# Patient Record
Sex: Female | Born: 1955 | Race: Black or African American | Hispanic: No | Marital: Married | Smoking: Never smoker
Health system: Southern US, Community
[De-identification: ages and names within clinical notes are randomized; demographics above are authoritative.]

## PROBLEM LIST (undated history)

## (undated) DIAGNOSIS — E785 Hyperlipidemia, unspecified: Secondary | ICD-10-CM

## (undated) DIAGNOSIS — D259 Leiomyoma of uterus, unspecified: Secondary | ICD-10-CM

## (undated) DIAGNOSIS — E119 Type 2 diabetes mellitus without complications: Secondary | ICD-10-CM

## (undated) DIAGNOSIS — R002 Palpitations: Secondary | ICD-10-CM

## (undated) DIAGNOSIS — K219 Gastro-esophageal reflux disease without esophagitis: Secondary | ICD-10-CM

## (undated) DIAGNOSIS — M1990 Unspecified osteoarthritis, unspecified site: Secondary | ICD-10-CM

## (undated) DIAGNOSIS — I4891 Unspecified atrial fibrillation: Secondary | ICD-10-CM

## (undated) DIAGNOSIS — I10 Essential (primary) hypertension: Secondary | ICD-10-CM

## (undated) DIAGNOSIS — E669 Obesity, unspecified: Secondary | ICD-10-CM

## (undated) DIAGNOSIS — F419 Anxiety disorder, unspecified: Secondary | ICD-10-CM

## (undated) DIAGNOSIS — H269 Unspecified cataract: Secondary | ICD-10-CM

## (undated) DIAGNOSIS — D649 Anemia, unspecified: Secondary | ICD-10-CM

## (undated) HISTORY — DX: Unspecified atrial fibrillation: I48.91

## (undated) HISTORY — DX: Essential (primary) hypertension: I10

## (undated) HISTORY — DX: Obesity, unspecified: E66.9

## (undated) HISTORY — PX: POLYPECTOMY: SHX149

## (undated) HISTORY — DX: Leiomyoma of uterus, unspecified: D25.9

## (undated) HISTORY — DX: Type 2 diabetes mellitus without complications: E11.9

## (undated) HISTORY — DX: Gastro-esophageal reflux disease without esophagitis: K21.9

## (undated) HISTORY — DX: Anxiety disorder, unspecified: F41.9

## (undated) HISTORY — DX: Palpitations: R00.2

## (undated) HISTORY — DX: Unspecified osteoarthritis, unspecified site: M19.90

## (undated) HISTORY — DX: Unspecified cataract: H26.9

## (undated) HISTORY — PX: ABDOMINAL HYSTERECTOMY: SHX81

## (undated) HISTORY — DX: Anemia, unspecified: D64.9

## (undated) HISTORY — PX: COLONOSCOPY: SHX174

## (undated) HISTORY — DX: Hyperlipidemia, unspecified: E78.5

---

## 1998-02-19 HISTORY — PX: HERNIA REPAIR: SHX51

## 1998-04-16 ENCOUNTER — Encounter: Payer: Self-pay | Admitting: Emergency Medicine

## 1998-04-16 ENCOUNTER — Emergency Department (HOSPITAL_COMMUNITY): Admission: EM | Admit: 1998-04-16 | Discharge: 1998-04-16 | Payer: Self-pay | Admitting: Emergency Medicine

## 1999-03-20 ENCOUNTER — Other Ambulatory Visit: Admission: RE | Admit: 1999-03-20 | Discharge: 1999-03-20 | Payer: Self-pay | Admitting: Internal Medicine

## 1999-03-20 ENCOUNTER — Encounter (INDEPENDENT_AMBULATORY_CARE_PROVIDER_SITE_OTHER): Payer: Self-pay | Admitting: Specialist

## 2000-07-03 ENCOUNTER — Emergency Department (HOSPITAL_COMMUNITY): Admission: EM | Admit: 2000-07-03 | Discharge: 2000-07-03 | Payer: Self-pay | Admitting: Emergency Medicine

## 2000-07-03 ENCOUNTER — Encounter: Payer: Self-pay | Admitting: *Deleted

## 2000-07-29 ENCOUNTER — Encounter: Payer: Self-pay | Admitting: *Deleted

## 2000-07-29 ENCOUNTER — Ambulatory Visit (HOSPITAL_COMMUNITY): Admission: RE | Admit: 2000-07-29 | Discharge: 2000-07-29 | Payer: Self-pay | Admitting: *Deleted

## 2001-08-04 ENCOUNTER — Ambulatory Visit (HOSPITAL_COMMUNITY): Admission: RE | Admit: 2001-08-04 | Discharge: 2001-08-04 | Payer: Self-pay | Admitting: Family Medicine

## 2001-08-04 ENCOUNTER — Encounter: Payer: Self-pay | Admitting: Family Medicine

## 2002-09-21 ENCOUNTER — Ambulatory Visit (HOSPITAL_COMMUNITY): Admission: RE | Admit: 2002-09-21 | Discharge: 2002-09-21 | Payer: Self-pay | Admitting: Internal Medicine

## 2002-09-21 ENCOUNTER — Encounter: Payer: Self-pay | Admitting: Internal Medicine

## 2002-09-25 ENCOUNTER — Ambulatory Visit (HOSPITAL_COMMUNITY): Admission: RE | Admit: 2002-09-25 | Discharge: 2002-09-25 | Payer: Self-pay | Admitting: Internal Medicine

## 2002-09-25 ENCOUNTER — Encounter: Payer: Self-pay | Admitting: Internal Medicine

## 2003-09-28 ENCOUNTER — Ambulatory Visit (HOSPITAL_COMMUNITY): Admission: RE | Admit: 2003-09-28 | Discharge: 2003-09-28 | Payer: Self-pay | Admitting: Internal Medicine

## 2004-02-20 HISTORY — PX: JOINT REPLACEMENT: SHX530

## 2004-09-29 ENCOUNTER — Ambulatory Visit (HOSPITAL_COMMUNITY): Admission: RE | Admit: 2004-09-29 | Discharge: 2004-09-29 | Payer: Self-pay | Admitting: Internal Medicine

## 2004-12-17 ENCOUNTER — Emergency Department (HOSPITAL_COMMUNITY): Admission: EM | Admit: 2004-12-17 | Discharge: 2004-12-17 | Payer: Self-pay | Admitting: Emergency Medicine

## 2005-01-05 ENCOUNTER — Inpatient Hospital Stay (HOSPITAL_COMMUNITY): Admission: RE | Admit: 2005-01-05 | Discharge: 2005-01-08 | Payer: Self-pay | Admitting: Orthopedic Surgery

## 2005-02-26 ENCOUNTER — Encounter (HOSPITAL_COMMUNITY): Admission: RE | Admit: 2005-02-26 | Discharge: 2005-03-28 | Payer: Self-pay | Admitting: Orthopedic Surgery

## 2005-10-01 ENCOUNTER — Ambulatory Visit (HOSPITAL_COMMUNITY): Admission: RE | Admit: 2005-10-01 | Discharge: 2005-10-01 | Payer: Self-pay | Admitting: Family Medicine

## 2006-10-07 ENCOUNTER — Ambulatory Visit (HOSPITAL_COMMUNITY): Admission: RE | Admit: 2006-10-07 | Discharge: 2006-10-07 | Payer: Self-pay | Admitting: Family Medicine

## 2007-10-09 ENCOUNTER — Ambulatory Visit (HOSPITAL_COMMUNITY): Admission: RE | Admit: 2007-10-09 | Discharge: 2007-10-09 | Payer: Self-pay | Admitting: Internal Medicine

## 2008-10-11 ENCOUNTER — Ambulatory Visit (HOSPITAL_COMMUNITY): Admission: RE | Admit: 2008-10-11 | Discharge: 2008-10-11 | Payer: Self-pay | Admitting: Internal Medicine

## 2008-12-01 ENCOUNTER — Ambulatory Visit (HOSPITAL_COMMUNITY): Admission: RE | Admit: 2008-12-01 | Discharge: 2008-12-01 | Payer: Self-pay | Admitting: Family Medicine

## 2009-04-08 ENCOUNTER — Encounter (INDEPENDENT_AMBULATORY_CARE_PROVIDER_SITE_OTHER): Payer: Self-pay | Admitting: General Surgery

## 2009-04-08 ENCOUNTER — Inpatient Hospital Stay (HOSPITAL_COMMUNITY): Admission: RE | Admit: 2009-04-08 | Discharge: 2009-04-10 | Payer: Self-pay | Admitting: General Surgery

## 2009-10-13 ENCOUNTER — Ambulatory Visit (HOSPITAL_COMMUNITY): Admission: RE | Admit: 2009-10-13 | Discharge: 2009-10-13 | Payer: Self-pay | Admitting: Internal Medicine

## 2009-12-15 ENCOUNTER — Encounter (HOSPITAL_COMMUNITY)
Admission: RE | Admit: 2009-12-15 | Discharge: 2010-01-14 | Payer: Self-pay | Source: Home / Self Care | Admitting: Internal Medicine

## 2010-03-12 ENCOUNTER — Encounter: Payer: Self-pay | Admitting: Family Medicine

## 2010-05-11 LAB — COMPREHENSIVE METABOLIC PANEL
ALT: 20 U/L (ref 0–35)
AST: 23 U/L (ref 0–37)
Albumin: 3.8 g/dL (ref 3.5–5.2)
Alkaline Phosphatase: 59 U/L (ref 39–117)
BUN: 13 mg/dL (ref 6–23)
CO2: 28 mEq/L (ref 19–32)
Calcium: 9.5 mg/dL (ref 8.4–10.5)
Chloride: 105 mEq/L (ref 96–112)
Creatinine, Ser: 0.73 mg/dL (ref 0.4–1.2)
GFR calc Af Amer: >60 mL/min (ref >60)
GFR calc non Af Amer: >60 mL/min (ref >60)
Glucose, Bld: 95 mg/dL (ref 70–99)
Potassium: 4.1 mEq/L (ref 3.5–5.1)
Sodium: 141 mEq/L (ref 135–145)
Total Bilirubin: 0.4 mg/dL (ref 0.3–1.2)
Total Protein: 6.8 g/dL (ref 6.0–8.3)

## 2010-05-11 LAB — DIFFERENTIAL
Basophils Absolute: 0.1 10*3/uL (ref 0.0–0.1)
Basophils Relative: 1 % (ref 0–1)
Eosinophils Absolute: 0.1 10*3/uL (ref 0.0–0.7)
Eosinophils Relative: 1 % (ref 0–5)
Lymphocytes Relative: 29 % (ref 12–46)
Lymphs Abs: 1.8 10*3/uL (ref 0.7–4.0)
Monocytes Absolute: 0.6 10*3/uL (ref 0.1–1.0)
Monocytes Relative: 10 % (ref 3–12)
Neutro Abs: 3.7 10*3/uL (ref 1.7–7.7)
Neutrophils Relative %: 59 % (ref 43–77)

## 2010-05-11 LAB — CBC
HCT: 39.7 % (ref 36.0–46.0)
Hemoglobin: 13 g/dL (ref 12.0–15.0)
MCHC: 32.7 g/dL (ref 30.0–36.0)
MCV: 82.9 fL (ref 78.0–100.0)
Platelets: 233 10*3/uL (ref 150–400)
RBC: 4.79 MIL/uL (ref 3.87–5.11)
RDW: 16.1 % — ABNORMAL HIGH (ref 11.5–15.5)
WBC: 6.3 10*3/uL (ref 4.0–10.5)

## 2010-05-25 LAB — CREATININE, SERUM
Creatinine, Ser: 0.8 mg/dL (ref 0.4–1.2)
GFR calc Af Amer: >60 mL/min (ref >60)
GFR calc non Af Amer: >60 mL/min (ref >60)

## 2010-07-07 NOTE — Discharge Summary (Signed)
NAMEGERLINE, RATTO        ACCOUNT NO.:  [id number]   MEDICAL RECORD NO.:  [id number]          PATIENT TYPE:  INP   LOCATION:  5009                         FACILITY:  [facility]   PHYSICIAN:  Dyke Brackett, M.D.    DATE OF BIRTH:  15-Apr-1955   DATE OF ADMISSION:  01/05/2005  DATE OF DISCHARGE:  01/08/2005                                 DISCHARGE SUMMARY   ADMISSION DIAGNOSES:  1.  End-stage osteoarthritis -- bilateral knees, left worse than right.  2.  Gastroesophageal reflux disease.  3.  Hypertension.  4.  History of recent healed peptic ulcer disease.   DISCHARGE DIAGNOSES:  1.  End-stage osteoarthritis -- bilateral knees, status post left total knee      arthroplasty.  2.  Acute blood loss anemia secondary to surgery.  3.  Hypokalemia.  4.  Mild cellulitis at site of incision.  5.  Gastroesophageal reflux disease.  6.  Hypertension.  7.  History of recent healed peptic ulcer.   SURGICAL PROCEDURES:  On January 05, 2005 Ms. Lackman underwent a left  total knee arthroplasty by Dr. Marcie Mowers, assisted by Richardean Canal  PA-C. She had a DePuy NBT keel tibial tray cemented size 3 with an LCS  complete primary femoral component cemented size standard left. A LCS  complete metal back patella cemented size standard and LCS complete RP  insert size standard 15 mm thickness.   COMPLICATIONS:  None.   CONSULTS:  1.  Physical therapy case management consult, January 06, 2005.  2.  Occupational therapy consult, January 08, 2005.   HISTORY OF PRESENT ILLNESS:  This 55 year old year old female presented to  Dr. Madelon Lips with an 8-year history of gradual onset of progressive left knee  pain. Pain is a constant, sharp sensation over the medial joint line without  radiation. It increases with prolonged sitting, and decreases with  extension, rest and ice. The knee pops, catches, and gives way and swells.  It keeps her up at night. She has failed conservative treatment  and x-rays  show end-stage arthritic changes. Because of that, she is presenting for a  left knee replacement.   HOSPITAL COURSE:  Ms. Speece tolerated her surgical procedure well and  without immediate postoperative complications. She was transferred to 5000.  On Postop day #1 she was afebrile, and vitals stable. Her leg was  neurovascularly intact. She was started on therapeutic protocol and switched  to p.o. pain medications.   Postop day #2 she continued to do well. The left knee incision was benign.  Hemoglobin 9.9, hematocrit 29.4. She was continued on therapy.   On postop day #3 she was noted to have a low potassium the day before, and  that was supplemented. She was having some redness about the knee incision,  so she was started on Keflex 500 mg p.o. q.i.d. for 7 days. She was  otherwise doing well, and it was felt that she was stable for discharge  home; was discharged home later that day.   DISCHARGE INSTRUCTIONS:   DIET:  She can resume her regular prehospitalization diet.   MEDICATIONS:  She may resume  her prehospitalization medications, except no  Celebrex, diclofenac or hydrocodone while on her other medications. Home  medications  otherwise will include:  1.  Enalapril 10 mg p.o. q.a.m..  2.  Hydrochlorothiazide 25 mg p.o. q.a.m.Marland Kitchen  3.  Prevacid 30 mg p.o. q.a.m.Marland Kitchen  4.  Multivitamin 1 tablet p.o. q.a.m..  5.  Chondroitin and glucosamine p.r.n..   Additional medications at this time include:  1.  Lovenox 40 mg subcutaneously q. 6 a.m. (last dose January 20, 2005).  2.  Percocet 5/325 mg 1-2 tablets p.o. q.4-6 h p.r.n. for pain.  3.  Keflex 500 mg 1 tablet p.o. q.i.d. for 7 days.  4.  K-Dur 20 mEq 1 tablet p.o. q.a.m. x3 days.  5.  OxyContin 10 mg 1 tablet p.o. q.12 h for pain.   ACTIVITY:  She can be out of bed; 50% weightbearing on the left leg with the  use of the walker.  She is to have PT per [facility], and home  CPM 0-90 degrees 6-8 hours  a day; and it is to increase to that limit.  Please see the blue total knee discharge sheet for further activity  instructions.   WOUND CARE:  She is to keep the knee incision clean and dry, and to change  the dressing daily. She is to notify Dr. Madelon Lips of a temperature greater  than 101.5 degrees Fahrenheit, foul-smelling drainage from the wound, or  pain not under control. She is to wear the TED stockings during the day.   FOLLOW-UP:  She is to follow up with Dr. Madelon Lips in our office in 11 days,  and is to call [phone] for that appointment.   LABORATORY DATA:  X-ray taken of her left knee on November 17 showed the  knee replacement without evidence for immediate hardware complication.   Hemoglobin/hematocrit ranged from 12.6 and 38.3 on November 13, to a low of  9.7 and 28.6 on November 20. White count ranged from 6.6 on November 13 to  12.3 on November 20; and platelets remained within normal limits.   Sodium dropped to low of 134 on November 18, otherwise it was within normal  limits. Potassium went from 3.8 on November 13 to 2.6 on November 20.  Glucose ranged from 106 on November 13 to 141 on November 18. BUN and  creatinine were 8.0 and 0.9 on the 13th, and dropped to a low of 1.0 and 0.7  on the 19th. Calcium ranged from 9.9 on the 13th to 8.3 on the 19th. All  other laboratory studies were within normal limits.      Legrand Pitts Duffy, Arnetha Courser, M.D.  Electronically Signed    KED/MEDQ  D:  03/02/2005  T:  03/02/2005  Job:  [id number]   cc:   Jeoffrey Massed, MD  Fax: [phone]   Madelin Rear. Sherwood Gambler, MD  Fax: [phone]

## 2010-07-07 NOTE — H&P (Signed)
NAME:  Emily Andersen, Emily Andersen        ACCOUNT NO.:  [id number]   MEDICAL RECORD NO.:  [id number]          PATIENT TYPE:  INP   LOCATION:  NA                           FACILITY:  [facility]   PHYSICIAN:  Dyke Brackett, M.D.    DATE OF BIRTH:  01-25-1956   DATE OF ADMISSION:  01/05/2005  DATE OF DISCHARGE:                                HISTORY & PHYSICAL   CHIEF COMPLAINT:  Left knee pain for the last 8 years.   HISTORY OF PRESENT ILLNESS:  This 55 year old female patient presents to Dr.  Madelon Lips with an 8-year history of gradual onset but progressively worsening  left knee pain. She has had no known injury or prior surgery to her left  knee. She has had similar problems and finding of early arthritis in the  right knee in the past.   At this point, the left knee pain is constant, sharp sensation over the  medial joint line without radiation. Pain increases with any prolonged  sitting at night or if she gets up from sitting down. Pain decreases with  extending the knee, rest, and ice. She does complain of the knee popping,  catching, giving way at times, and swelling. It does keep her up at night.  There is no grinding or locking. She has received Synvisc injections in the  right knee in the past and cortisone, and that has provided some relief. She  is using a pull-over knee sleeve on the left knee now, and that does help,  and she uses crutches as needed.   No known drug allergies.   CURRENT MEDICATIONS:  1.  Percocet 5/325 mg 1 tablet p.o. twice daily.  2.  Enalapril 10 mg 1 tablet p.o. q.a.m.  3.  Hydrochlorothiazide 25 mg 1 tablet p.o. q.a.m.  4.  Diclofenac 75 mg 1 tablet p.o. twice daily. She is to stop taking these      on November 12.  5.  Prevacid 30 mg 1 tablet p.o. q.a.m..  6.  Multivitamin 1 tablet p.o. q.a.m.Marland Kitchen  7.  Chondroitin and glucosamine.   PAST MEDICAL HISTORY:  1.  Hypertension diagnosed 2 years ago.  2.  Peptic ulcer, now healed, diagnosed in April 2006.  3.   Gastroesophageal reflux disease x14 years.   She denies any history of diabetes mellitus, thyroid disease, hiatal hernia,  heart disease, asthma, or any other chronic medical condition other than  noted previously.   PAST SURGICAL HISTORY:  1.  Colonoscopy with removal of polyp 2006 by Dr. Noe Gens.  2.  Upper GI, found to have one ulcer 2006 by Dr. Noe Gens.  3.  Partial hysterectomy in 1994 due to fibroids by Dr. Katrinka Blazing.  4.  Removal of left index finger from a lawnmower accident in 1999 by Dr.      Katrinka Blazing.   She denies any complications from the above-mentioned procedures.   SOCIAL HISTORY:  She denies any history of cigarette smoking, alcohol use or  drug use. She is married and has one son. She lives with her husband in a  one-story house, one step into the main entrance. 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 Her medical doctor is Dr. Regino Schultze and Dr.  Sherwood Gambler in [place] at [phone].   FAMILY HISTORY:  Mother is alive at age 8 with Alzheimer's. Father died at  age 59 with COPD and a heart attack. She has five brothers who are alive at  ages 44, 44, 5, 73, 39, and one sister alive at age 10. The sister does  have asthma. She had one brother who died of a gunshot wound and one sister  who died at age 51 with COPD, heart problems, and aspiration. Her son is 105.  He is alive and well.   REVIEW OF SYSTEMS:  She does have dentures on both upper and lower jaw line.  She wears glasses. She has some arthritic complaints of her right knee. She  has a history of a healed ulcer this year. She does not have a living will  nor power of attorney. All other systems are negative and noncontributory.   PHYSICAL EXAMINATION:  GENERAL:  Well-developed, well-nourished overweight  female in no acute distress. Walks with a significant right-sided limp and  antalgic gait. Mood and affect are appropriate. Talks easily with examiner.  Height 5 feet 1 inch, weight 237 pounds, BMI is 44.   VITAL SIGNS:  Temperature 98.2 degrees F, pulse 76, respirations 22, and  blood pressure 114/82.  HEENT: Normocephalic, atraumatic without frontal or maxillary sinus  tenderness to palpation. Conjunctivae pink. Sclerae anicteric. PERRLA. EOMs  intact. No visible external ear deformities. Hearing grossly intact.  Tympanic membranes pearly gray bilaterally with good light reflex. Nose and  nasal septum midline. Nasal mucosa pink and moist without exudates or polyps  noted. Buccal mucosa pink and moist. Dentures in place. Pharynx without  erythema or exudates. Tongue and uvula midline. Tongue without  fasciculations, and uvula rises equally with phonation.  NECK: No visible masses or lesions noted. Trachea midline. No palpable  lymphadenopathy or thyromegaly. Carotids +2 bilaterally without bruits. Full  range of motion, nontender to palpation along the cervical spine.  CARDIOVASCULAR:  Heart rate and rhythm regular. S1-S2 present without rubs,  clicks or murmurs noted.  RESPIRATORY: Respirations even and unlabored. Breath sounds clear to  auscultation bilaterally without rales or wheezes noted.  ABDOMEN: Rounded abdominal contour. Bowel sounds present x4 quadrants. Soft,  nontender to palpation without hepatosplenomegaly or CVA tenderness. Femoral  pulses +1 bilaterally. Nontender to palpation along the vertebral column.  BREASTS/GU/RECTAL/PELVIC: These exams deferred at this time.  MUSCULOSKELETAL: No obvious deformities bilateral upper extremities with the  exception of the missing portion of the and left index finger which is  amputated cleanly at the DIP joint. She has full range of motion of her  upper extremities without pain. Radial pulses +2 bilaterally. She has full  range of motion of her hips, ankles and toes bilaterally. DP and PT pulses  are +2. Mild +1 to 2 mildly pitting edema both lower extremities.  Right knee has full extension and flexion to 125 degrees with a lot of  gross crepitus with range of motion. No pain with palpation along the joint line.  Small +1 effusion. Stable varus and valgus stress. Negative anterior drawer.  Left knee skin intact without erythema or ecchymosis. She is lacking about 5  degrees to full extension and can flex the knee 110 degrees with some fine  crepitus. She is acutely tender to palpation over the medial joint line.  Plus one to two effusion. Stable to varus and valgus stress. Negative  anterior drawer.  NEUROLOGIC: Alert and oriented x3. Cranial nerves II-XII are grossly intact.  Strength 5/5 bilateral upper and lower extremities. Rapid alternating  movements intact. Deep tendon reflexes 2+ bilateral upper and lower  extremities. Sensation intact to light touch.   NEUROLOGIC FINDINGS:  Knee x-rays taken of her right knee in 2004 show  medial joint osteoarthritis. X-rays were taken of the left knee recently,  but we do not have the x-ray report of this time.   IMPRESSION:  1.  Osteoarthritis bilateral knees, left worse than right.  2.  Gastroesophageal reflux disease.  3.  Hypertension.  4.  History of recent healed peptic ulcer.   PLAN:  Ms. Deshmukh will be admitted to [facility] on January 05, 2005, where she will undergo a left total knee replacement by Dr. Lacretia Nicks.  Frederico Hamman. She will undergo all the routine preoperative laboratory  tests and studies prior to this procedure. She has been cleared for surgery  by Dr. Regino Schultze, her primary care physician. If we have any medical issues  while she is hospitalized, we will consult one of the hospitalists.      Legrand Pitts Duffy, Arnetha Courser, M.D.  Electronically Signed    KED/MEDQ  D:  12/26/2004  T:  12/26/2004  Job:  [id number]

## 2010-07-07 NOTE — Op Note (Signed)
NAME:  Emily Andersen, Emily Andersen        ACCOUNT NO.:  [id number]   MEDICAL RECORD NO.:  [id number]          PATIENT TYPE:  INP   LOCATION:  5009                         FACILITY:  [facility]   PHYSICIAN:  Dyke Brackett, M.D.    DATE OF BIRTH:  [birth year]   DATE OF PROCEDURE:  01/05/2005  DATE OF DISCHARGE:                                 OPERATIVE REPORT   PREOPERATIVE DIAGNOSIS:  Severe osteoarthritis, left knee, with varus  deformity.   POSTOPERATIVE DIAGNOSIS:  Severe osteoarthritis, left knee, with varus  deformity.   OPERATION:  Left total knee replacement (LCS cemented DePuy standard femur,  patella, size 3 tibia, with 15-mm bearing).   SURGEON:  Dyke Brackett, M.D.   [facility]SISTANTChestine Spore, P.A.   TOURNIQUET TIME:  1 hour, 45 minutes.   DESCRIPTION OF PROCEDURE:  Sterile prep and drape, examination of the leg,  inflation to 375 mm, straight skin incision, median parapatellar approach to  the knee made  Medial side of the knee was stripped due to the varus  deformity to release contracture of the medial collateral ligament.  Tibia  was cut about 2-3 mm below the most diseased medial compartment.  Due to the  severe deformity, a moderately aggressive cut was made relative to  correcting the varus using the external guide, followed by an anterior-  posterior cut with the flexion gap measured at 15 mm, later equalized to the  extension gap of 15.  Distal femoral cut with 40-degree valgus cut was made  followed by finishing a cut with chamfers and keel holes for the prosthesis.  Femoral trial fit anatomically.  The tibia had complete release of the PCL  off the tibial attachment, excision of remnants of the menisci.  The size 3  tibia was deemed to be appropriate, Keel hole was cut for the prosthesis  trial tibia, placed with a 15 mm bearing and the trial femur.  The patella  was cut leaving about 13 mm of native patella.  Three-peg patella trial.  The patient had full extension to slight  recurvatum noted.  Excellent  stability and extension.  Excellent balance medially and laterally.  No  flexion instability was noted.  Good symmetry was noted between flexion and  extension balance relative to the bearing.  Patella tracked normally.  After  the trials were removed, the bony surfaces were irrigated with pulsatile  lavage followed by insertion of the cement in the doughy state.  Tibia  followed by femur and patella, and after the cement was allowed to harden,  excess cement was removed.  The tourniquet was released.  Small bleeders  were coagulated.  Hemovac drain was placed exiting superolaterally.  The  closure was effected of the capsule with interrupted Ethibond, 2-0 Vicryl on  the subcutaneous tissues, and skin with skin clips.  Marcaine with  epinephrine infiltrated into the wound.  Lightly compressive sterile  dressing applied.  The patient taken to the recovery room in stable  condition.     Dyke Brackett, M.D.  Electronically Signed    WDC/MEDQ  D:  01/05/2005  T:  01/07/2005  Job:  [id number] 

## 2010-09-19 ENCOUNTER — Other Ambulatory Visit (HOSPITAL_COMMUNITY): Payer: Self-pay | Admitting: Family Medicine

## 2010-09-19 DIAGNOSIS — Z139 Encounter for screening, unspecified: Secondary | ICD-10-CM

## 2010-10-16 ENCOUNTER — Ambulatory Visit (HOSPITAL_COMMUNITY)
Admission: RE | Admit: 2010-10-16 | Discharge: 2010-10-16 | Disposition: A | Discharge status: Home / Self Care | Payer: Medicare Other | Source: Ambulatory Visit | Attending: Family Medicine | Admitting: Family Medicine

## 2010-10-16 DIAGNOSIS — Z1231 Encounter for screening mammogram for malignant neoplasm of breast: Secondary | ICD-10-CM | POA: Insufficient documentation

## 2010-10-16 DIAGNOSIS — Z139 Encounter for screening, unspecified: Secondary | ICD-10-CM

## 2011-01-26 ENCOUNTER — Ambulatory Visit (INDEPENDENT_AMBULATORY_CARE_PROVIDER_SITE_OTHER): Payer: Medicare Other

## 2011-01-26 DIAGNOSIS — M25579 Pain in unspecified ankle and joints of unspecified foot: Secondary | ICD-10-CM

## 2011-01-26 DIAGNOSIS — M25569 Pain in unspecified knee: Secondary | ICD-10-CM

## 2011-01-26 DIAGNOSIS — N760 Acute vaginitis: Secondary | ICD-10-CM

## 2011-01-26 DIAGNOSIS — Z23 Encounter for immunization: Secondary | ICD-10-CM

## 2011-01-26 DIAGNOSIS — R300 Dysuria: Secondary | ICD-10-CM

## 2011-05-11 ENCOUNTER — Ambulatory Visit (HOSPITAL_COMMUNITY)
Admission: RE | Admit: 2011-05-11 | Discharge: 2011-05-11 | Disposition: A | Discharge status: Home / Self Care | Payer: Medicare Other | Source: Ambulatory Visit | Attending: Internal Medicine | Admitting: Internal Medicine

## 2011-05-11 ENCOUNTER — Other Ambulatory Visit (HOSPITAL_COMMUNITY): Payer: Self-pay | Admitting: Internal Medicine

## 2011-05-11 DIAGNOSIS — M25569 Pain in unspecified knee: Secondary | ICD-10-CM

## 2011-05-11 DIAGNOSIS — M79609 Pain in unspecified limb: Secondary | ICD-10-CM | POA: Insufficient documentation

## 2011-06-09 ENCOUNTER — Ambulatory Visit: Payer: Medicare Other

## 2011-06-09 ENCOUNTER — Ambulatory Visit (INDEPENDENT_AMBULATORY_CARE_PROVIDER_SITE_OTHER): Payer: Medicare Other | Admitting: Family Medicine

## 2011-06-09 VITALS — BP 149/94 | HR 57 | Temp 97.9°F | Resp 16 | Ht 65.5 in | Wt 257.4 lb

## 2011-06-09 DIAGNOSIS — M25571 Pain in right ankle and joints of right foot: Secondary | ICD-10-CM

## 2011-06-09 DIAGNOSIS — M25579 Pain in unspecified ankle and joints of unspecified foot: Secondary | ICD-10-CM

## 2011-06-09 DIAGNOSIS — S93401A Sprain of unspecified ligament of right ankle, initial encounter: Secondary | ICD-10-CM

## 2011-06-09 DIAGNOSIS — S93409A Sprain of unspecified ligament of unspecified ankle, initial encounter: Secondary | ICD-10-CM

## 2011-06-09 NOTE — Progress Notes (Signed)
Emily Andersen fit and trained for right ankle sweedo.

## 2011-06-09 NOTE — Progress Notes (Signed)
[facility]:  Office Visit  Chief Complaint:  Chief Complaint  Patient presents with  . Ankle Pain    twisting injury  4 weeks  . Foot Pain    HPI: Emily Andersen is a 56 y.o. female who complains of 2 month h/o sharp 10/10 radiating pain after twisting ankle, eversion. Went to podiatrist and got xrays which were negative and then was placed in cam walker. No improvement. Still swollen and pain with weight bearing. Tried Ibuprofen 400 mg daily without relief and icing.  Past Medical History  Diagnosis Date  . Hypertension   . Hyperlipidemia    Past Surgical History  Procedure Date  . Abdominal hysterectomy   . Hernia repair   . Joint replacement    History   Social History  . Marital Status: Married    Spouse Name: N/A    Number of Children: N/A  . Years of Education: N/A   Social History Main Topics  . Smoking status: Never Smoker   . Smokeless tobacco: None  . Alcohol Use: No  . Drug Use: No  . Sexually Active: None   Other Topics Concern  . None   Social History Narrative  . None   Family History  Problem Relation Age of Onset  . Heart disease Father    Allergies no known allergies Prior to Admission medications   Medication Sig Start Date End Date Taking? Authorizing Provider  lisinopril-hydrochlorothiazide (PRINZIDE,ZESTORETIC) 10-12.5 MG per tablet Take 1 tablet by mouth daily.   Yes Historical Provider, MD  simvastatin (ZOCOR) 10 MG tablet Take 10 mg by mouth at bedtime.   Yes Historical Provider, MD     ROS: The patient denies fevers, chills, night sweats, unintentional weight loss, chest pain, palpitations, wheezing, dyspnea on exertion, nausea, vomiting, abdominal pain, dysuria, hematuria, melena, numbness, weakness, or tingling. + ankle pain  All other systems have been reviewed and were otherwise negative with the exception of those mentioned in the HPI and as above.    PHYSICAL EXAM: Filed Vitals:   06/09/11 0954   BP: 149/94  Pulse: 57  Temp:   Resp:    Filed Vitals:   06/09/11 0820  Height: 5' 5.5" (1.664 m)  Weight: 257 lb 6.4 oz (116.756 kg)   Body mass index is 42.18 kg/(m^2).  General: Alert, no acute distress, morbidly obese HEENT:  Normocephalic, atraumatic, oropharynx patent.  Cardiovascular:  Regular rate and rhythm, no rubs murmurs or gallops.  No Carotid bruits, radial pulse intact. No pedal edema.  Respiratory: Clear to auscultation bilaterally.  No wheezes, rales, or rhonchi.  No cyanosis, no use of accessory musculature GI: No organomegaly, abdomen is soft and non-tender, positive bowel sounds.  No masses. Skin: No rashes. Neurologic: Facial musculature symmetric. Psychiatric: Patient is appropriate throughout our interaction. Lymphatic: No cervical lymphadenopathy Musculoskeletal: Gait intact. Right ankle: lateral malleoli swelling, medial malleoli tenderness; neurovascularly intact. DP present. 2/2 DTR. ROM intact   LABS:  EKG/XRAY:   Primary read interpreted by Dr. Conley Rolls at [facility]. No dislocation or fx.    ASSESSMENT/PLAN: Encounter Diagnoses  Name Primary?  . Right ankle pain Yes  . Right ankle sprain    RICE Motrin 800 mg BID Ankle brace with strings attached given to patient, she feels better using it rather than the camwalker that was given to her by the podiatrist F/u prn Monitor BP if > 140/90 consistently then need to f/u for change in BP med  Sheza Strickland PHUONG, DO 06/09/2011 3:12 PM

## 2011-10-31 ENCOUNTER — Other Ambulatory Visit (HOSPITAL_COMMUNITY): Payer: Self-pay | Admitting: Family Medicine

## 2011-10-31 DIAGNOSIS — Z139 Encounter for screening, unspecified: Secondary | ICD-10-CM

## 2011-11-05 ENCOUNTER — Ambulatory Visit (HOSPITAL_COMMUNITY)
Admission: RE | Admit: 2011-11-05 | Discharge: 2011-11-05 | Disposition: A | Discharge status: Home / Self Care | Payer: Medicare Other | Source: Ambulatory Visit | Attending: Family Medicine | Admitting: Family Medicine

## 2011-11-05 DIAGNOSIS — Z1231 Encounter for screening mammogram for malignant neoplasm of breast: Secondary | ICD-10-CM | POA: Insufficient documentation

## 2011-11-05 DIAGNOSIS — Z139 Encounter for screening, unspecified: Secondary | ICD-10-CM

## 2012-09-21 ENCOUNTER — Emergency Department (HOSPITAL_COMMUNITY)
Admission: EM | Admit: 2012-09-21 | Discharge: 2012-09-21 | Disposition: A | Discharge status: Home / Self Care | Payer: Medicare Other | Source: Home / Self Care

## 2012-09-21 ENCOUNTER — Encounter (HOSPITAL_COMMUNITY): Payer: Self-pay

## 2012-09-21 ENCOUNTER — Emergency Department (INDEPENDENT_AMBULATORY_CARE_PROVIDER_SITE_OTHER): Payer: Medicare Other

## 2012-09-21 DIAGNOSIS — M1710 Unilateral primary osteoarthritis, unspecified knee: Secondary | ICD-10-CM

## 2012-09-21 DIAGNOSIS — M7050 Other bursitis of knee, unspecified knee: Secondary | ICD-10-CM

## 2012-09-21 DIAGNOSIS — IMO0002 Reserved for concepts with insufficient information to code with codable children: Secondary | ICD-10-CM

## 2012-09-21 DIAGNOSIS — M1711 Unilateral primary osteoarthritis, right knee: Secondary | ICD-10-CM

## 2012-09-21 MED ORDER — METHYLPREDNISOLONE ACETATE 40 MG/ML IJ SUSP
INTRAMUSCULAR | Status: AC
  Filled 2012-09-21: qty 5

## 2012-09-21 NOTE — ED Notes (Addendum)
C/o right leg pain, patient states that when she sits or is inactive for a while her right leg gets "stiff" and hurts, states no swelling just pain, usually gets better with activity, sx for approx 2 weeks

## 2012-09-25 NOTE — ED Provider Notes (Signed)
Medical screening examination/treatment/procedure(s) were performed by non-physician practitioner and as supervising physician I was immediately available for consultation/collaboration.  Leslee Home, M.D.  Reuben Likes, MD 09/25/12 1318

## 2012-09-25 NOTE — ED Provider Notes (Signed)
CSN: [id number]     Arrival date & time 09/21/12  4132 History     First MD Initiated Contact with Patient 09/21/12 1027     Chief Complaint  Patient presents with  . Leg Pain   (Consider location/radiation/quality/duration/timing/severity/associated sxs/prior Treatment) HPI  57 yo bf presents today with a 2 week hx of right lower leg pain.  States that she has a know hx of right knee djd.  Most of her discomfort is at the pes bursa region.  No injury.  Pain at time radiates down her shin.  Worse with prolonged ambulation but does not limit activities.  Knee stiff after sitting for a long time.  S/p left total knee replacement br dr Madelon Lips several years ago and this is doing well.  Past Medical History  Diagnosis Date  . Hypertension   . Hyperlipidemia    Past Surgical History  Procedure Laterality Date  . Abdominal hysterectomy    . Hernia repair    . Joint replacement     Family History  Problem Relation Age of Onset  . Heart disease Father    History  Substance Use Topics  . Smoking status: Never Smoker   . Smokeless tobacco: Not on file  . Alcohol Use: No   OB History   Grav Para Term Preterm Abortions TAB SAB Ect Mult Living                 Review of Systems  Constitutional: Negative.   HENT: Negative.   Eyes: Negative.   Respiratory: Negative.   Cardiovascular: Negative.   Gastrointestinal: Negative.   Genitourinary: Negative.   Musculoskeletal: Positive for joint swelling.  Skin: Negative.   Hematological: Negative.   Psychiatric/Behavioral: Negative.     Allergies  Review of patient's allergies indicates no known allergies.  Home Medications   Current Outpatient Rx  Name  Route  Sig  Dispense  Refill  . aspirin 81 MG tablet   Oral   Take 81 mg by mouth daily.         Marland Kitchen lisinopril-hydrochlorothiazide (PRINZIDE,ZESTORETIC) 10-12.5 MG per tablet   Oral   Take 1 tablet by mouth daily.         . simvastatin (ZOCOR) 10 MG tablet   Oral  Take 10 mg by mouth at bedtime.          BP 167/79  Pulse 68  Temp(Src) 98.1 F (36.7 C) (Oral)  Resp 22  SpO2 100% Physical Exam  Constitutional: She is oriented to person, place, and time. She appears well-developed and well-nourished.  HENT:  Head: Normocephalic and atraumatic.  Eyes: EOM are normal. Pupils are equal, round, and reactive to light.  Neck: Normal range of motion.  Pulmonary/Chest: Effort normal.  Musculoskeletal: Normal range of motion. She exhibits tenderness (pes bursa, and joint line on the right. ).  Neurological: She is alert and oriented to person, place, and time.  Skin: Skin is warm and dry.  Psychiatric: She has a normal mood and affect.    ED Course   Injection of joint Date/Time: 09/25/2012 11:34 AM Performed by: Zonia Kief Authorized by: Zonia Kief Consent: Verbal consent obtained. Risks and benefits: risks, benefits and alternatives were discussed Consent given by: patient Patient understanding: patient states understanding of the procedure being performed Patient consent: the patient's understanding of the procedure matches consent given Procedure consent: procedure consent matches procedure scheduled Relevant documents: relevant documents present and verified Test results: test results available and properly labeled Site  marked: the operative site was marked Imaging studies: imaging studies available Required items: required blood products, implants, devices, and special equipment available Patient identity confirmed: verbally with patient Preparation: Patient was prepped and draped in the usual sterile fashion. Local anesthesia used: yes Local anesthetic: bupivacaine 0.5% without epinephrine Anesthetic total: 2 ml Patient sedated: no Patient tolerance: Patient tolerated the procedure well with no immediate complications. Comments: Marcaine/depomedrol 2:1 pes bursa injection was done.    (including critical care time)  Labs  Reviewed - No data to display No results found. 1. Right knee DJD   2. Pes anserine bursitis     MDM  Patient will f/u with dr caffrey within the next couple of weeks.  Reported good relief after pes bursa injection today.  xrays reviewed.  All questions answered.      Zonia Kief, PA-C 09/25/12 1137

## 2012-09-29 ENCOUNTER — Other Ambulatory Visit (HOSPITAL_COMMUNITY): Payer: Self-pay | Admitting: Family Medicine

## 2012-09-29 DIAGNOSIS — Z139 Encounter for screening, unspecified: Secondary | ICD-10-CM

## 2012-10-22 ENCOUNTER — Encounter: Payer: Self-pay | Admitting: Internal Medicine

## 2012-10-28 ENCOUNTER — Telehealth: Payer: Self-pay | Admitting: Internal Medicine

## 2012-10-28 ENCOUNTER — Ambulatory Visit (AMBULATORY_SURGERY_CENTER): Payer: Self-pay

## 2012-10-28 VITALS — Ht 68.0 in | Wt 256.4 lb

## 2012-10-28 DIAGNOSIS — Z86010 Personal history of colonic polyps: Secondary | ICD-10-CM

## 2012-10-28 MED ORDER — MOVIPREP 100 G PO SOLR: ORAL | Status: DC

## 2012-10-28 NOTE — Telephone Encounter (Signed)
Spoke with pt regarding her Suprep Rx was not at the pharmacy.The pt states that [facility] has problems getting our prescriptions electronically. I will call in a prescription for the Suprep bowel prep, [facility] kit to [facility] on [address] in [place]. The pt was advised to call us back if she still has any problems getting the bowel prep. She understood.

## 2012-10-29 ENCOUNTER — Encounter: Payer: Self-pay | Admitting: Internal Medicine

## 2012-11-10 ENCOUNTER — Telehealth: Payer: Self-pay | Admitting: Internal Medicine

## 2012-11-10 ENCOUNTER — Ambulatory Visit (HOSPITAL_COMMUNITY): Payer: Medicare Other

## 2012-11-10 NOTE — Telephone Encounter (Signed)
Pt states that she drank her prep starting at 3:00 p.m. Today instead of 5:00 p.m.  I told her to continue drinking her prep and to make sure she follows her instructions to drink prep at 3:30 a.m. Tomorrow with nothing to drink at all after 5:30 a.m.  Understanding voiced

## 2012-11-11 ENCOUNTER — Encounter: Payer: Self-pay | Admitting: Internal Medicine

## 2012-11-11 ENCOUNTER — Ambulatory Visit (AMBULATORY_SURGERY_CENTER): Payer: Medicare Other | Admitting: Internal Medicine

## 2012-11-11 VITALS — BP 137/65 | HR 56 | Temp 97.4°F | Resp 23 | Ht 68.0 in | Wt 256.0 lb

## 2012-11-11 DIAGNOSIS — Z86010 Personal history of colonic polyps: Secondary | ICD-10-CM

## 2012-11-11 DIAGNOSIS — Z1211 Encounter for screening for malignant neoplasm of colon: Secondary | ICD-10-CM

## 2012-11-11 MED ORDER — SODIUM CHLORIDE 0.9 % IV SOLN: 500.0000 mL | INTRAVENOUS | Status: DC

## 2012-11-11 NOTE — Progress Notes (Signed)
Report to pacu rn, vss, bbs=clear 

## 2012-11-11 NOTE — Patient Instructions (Addendum)
YOU HAD AN ENDOSCOPIC PROCEDURE TODAY AT THE [facility]: Refer to the procedure report that was given to you for any specific questions about what was found during the examination.  If the procedure report does not answer your questions, please call your gastroenterologist to clarify.  If you requested that your care partner not be given the details of your procedure findings, then the procedure report has been included in a sealed envelope for you to review at your convenience later.  YOU SHOULD EXPECT: Some feelings of bloating in the abdomen. Passage of more gas than usual.  Walking can help get rid of the air that was put into your GI tract during the procedure and reduce the bloating. If you had a lower endoscopy (such as a colonoscopy or flexible sigmoidoscopy) you may notice spotting of blood in your stool or on the toilet paper. If you underwent a bowel prep for your procedure, then you may not have a normal bowel movement for a few days.  DIET: Your first meal following the procedure should be a light meal and then it is ok to progress to your normal diet.  A half-sandwich or bowl of soup is an example of a good first meal.  Heavy or fried foods are harder to digest and may make you feel nauseous or bloated.  Likewise meals heavy in dairy and vegetables can cause extra gas to form and this can also increase the bloating.  Drink plenty of fluids but you should avoid alcoholic beverages for 24 hours.  ACTIVITY: Your care partner should take you home directly after the procedure.  You should plan to take it easy, moving slowly for the rest of the day.  You can resume normal activity the day after the procedure however you should NOT DRIVE or use heavy machinery for 24 hours (because of the sedation medicines used during the test).    SYMPTOMS TO REPORT IMMEDIATELY: A gastroenterologist can be reached at any hour.  During normal business hours, 8:30 AM to 5:00 PM Monday through Friday,  call [phone].  After hours and on weekends, please call the GI answering service at [phone] who will take a message and have the physician on call contact you.   Following lower endoscopy (colonoscopy or flexible sigmoidoscopy):  Excessive amounts of blood in the stool  Significant tenderness or worsening of abdominal pains  Swelling of the abdomen that is new, acute  Fever of 100F or higher   FOLLOW UP: If any biopsies were taken you will be contacted by phone or by letter within the next 1-3 weeks.  Call your gastroenterologist if you have not heard about the biopsies in 3 weeks.  Our staff will call the home number listed on your records the next business day following your procedure to check on you and address any questions or concerns that you may have at that time regarding the information given to you following your procedure. This is a courtesy call and so if there is no answer at the home number and we have not heard from you through the emergency physician on call, we will assume that you have returned to your regular daily activities without incident.  SIGNATURES/CONFIDENTIALITY: You and/or your care partner have signed paperwork which will be entered into your electronic medical record.  These signatures attest to the fact that that the information above on your After Visit Summary has been reviewed and is understood.  Full responsibility of the confidentiality of   this discharge information lies with you and/or your care-partner.    Handout were given to your care partner on diverticulosis and a high fiber diet. You may resume your current medications today.  Please call if any questions or concerns.

## 2012-11-11 NOTE — Progress Notes (Signed)
No complaints noted in the recovery room. Maw   

## 2012-11-11 NOTE — Op Note (Signed)
[facility] [address] [place], [address]   COLONOSCOPY PROCEDURE REPORT  PATIENT: Emily Andersen, Emily Andersen  MR#: [id number] BIRTHDATE: July 30, 1955 , 57  yrs. old GENDER: Female ENDOSCOPIST: Roxy Cedar, MD REFERRED [id number] Recall, PROCEDURE DATE:  11/11/2012 PROCEDURE:   Colonoscopy, screening First Screening Colonoscopy - Avg.  risk and is 50 yrs.  old or older - No.  Prior Negative Screening - Now for repeat screening. 10 or more years since last screening  History of Adenoma - Now for follow-up colonoscopy & has been > or = to 3 yrs.  N/A  Polyps Removed Today? No.  Recommend repeat exam, <10 yrs? No. ASA CLASS:   Class II INDICATIONS:average risk screening.  Negative exam 2004 MEDICATIONS: MAC sedation, administered by CRNA and propofol (Diprivan) 370mg  IV  DESCRIPTION OF PROCEDURE:   After the risks benefits and alternatives of the procedure were thoroughly explained, informed consent was obtained.  A digital rectal exam revealed no abnormalities of the rectum.   The LB [id number]  endoscope was introduced through the anus and advanced to the cecum, which was identified by both the appendix and ileocecal valve. No adverse events experienced.   The quality of the prep was good, using MoviPrep  The instrument was then slowly withdrawn as the colon was fully examined.   COLON FINDINGS: Mild diverticulosis was noted in the sigmoid colon. The colon mucosa was otherwise normal.  Retroflexed views revealed no abnormalities. The time to cecum=2 minutes 03 seconds. Withdrawal time=10 minutes 15 seconds.  The scope was withdrawn and the procedure completed.  COMPLICATIONS: There were no complications.  ENDOSCOPIC IMPRESSION: 1.   Mild diverticulosis was noted in the sigmoid colon 2.   The colon mucosa was otherwise normal  RECOMMENDATIONS: 1. Continue current colorectal screening recommendations for "routine risk" patients with a repeat  colonoscopy in 10 years.   eSigned:  Roxy Cedar, MD 11/11/2012 9:14 AM   cc: Elfredia Nevins, MD and The Patient

## 2012-11-12 ENCOUNTER — Telehealth: Payer: Self-pay | Admitting: *Deleted

## 2012-11-12 NOTE — Telephone Encounter (Signed)
  Follow up Call-  Call back number 11/11/2012  Post procedure Call Back phone  # [phone]  Permission to leave phone message Yes     Patient questions:  Do you have a fever, pain , or abdominal swelling? no Pain Score  0 *  Have you tolerated food without any problems? no  Have you been able to return to your normal activities? yes  Do you have any questions about your discharge instructions: Diet   no Medications  no Follow up visit  no  Do you have questions or concerns about your Care? no  Actions: * If pain score is 4 or above: No action needed, pain <4.

## 2012-11-13 ENCOUNTER — Ambulatory Visit (HOSPITAL_COMMUNITY)
Admission: RE | Admit: 2012-11-13 | Discharge: 2012-11-13 | Disposition: A | Discharge status: Home / Self Care | Payer: Medicare Other | Source: Ambulatory Visit | Attending: Family Medicine | Admitting: Family Medicine

## 2012-11-13 DIAGNOSIS — Z139 Encounter for screening, unspecified: Secondary | ICD-10-CM

## 2012-11-13 DIAGNOSIS — Z1231 Encounter for screening mammogram for malignant neoplasm of breast: Secondary | ICD-10-CM | POA: Insufficient documentation

## 2013-06-16 ENCOUNTER — Ambulatory Visit (INDEPENDENT_AMBULATORY_CARE_PROVIDER_SITE_OTHER): Payer: Commercial Managed Care - HMO | Admitting: Urology

## 2013-06-16 DIAGNOSIS — N3942 Incontinence without sensory awareness: Secondary | ICD-10-CM

## 2013-06-16 DIAGNOSIS — N318 Other neuromuscular dysfunction of bladder: Secondary | ICD-10-CM

## 2013-06-16 DIAGNOSIS — N3941 Urge incontinence: Secondary | ICD-10-CM

## 2013-10-14 ENCOUNTER — Other Ambulatory Visit (HOSPITAL_COMMUNITY): Payer: Self-pay | Admitting: Family Medicine

## 2013-10-14 DIAGNOSIS — Z1231 Encounter for screening mammogram for malignant neoplasm of breast: Secondary | ICD-10-CM

## 2013-11-16 ENCOUNTER — Ambulatory Visit (HOSPITAL_COMMUNITY)
Admission: RE | Admit: 2013-11-16 | Discharge: 2013-11-16 | Disposition: A | Discharge status: Home / Self Care | Payer: Medicare HMO | Source: Ambulatory Visit | Attending: Family Medicine | Admitting: Family Medicine

## 2013-11-16 DIAGNOSIS — Z1231 Encounter for screening mammogram for malignant neoplasm of breast: Secondary | ICD-10-CM | POA: Insufficient documentation

## 2014-04-27 DIAGNOSIS — Z6841 Body Mass Index (BMI) 40.0 and over, adult: Secondary | ICD-10-CM | POA: Diagnosis not present

## 2014-04-27 DIAGNOSIS — D649 Anemia, unspecified: Secondary | ICD-10-CM | POA: Diagnosis not present

## 2014-04-27 DIAGNOSIS — M1991 Primary osteoarthritis, unspecified site: Secondary | ICD-10-CM | POA: Diagnosis not present

## 2014-04-27 DIAGNOSIS — Z0001 Encounter for general adult medical examination with abnormal findings: Secondary | ICD-10-CM | POA: Diagnosis not present

## 2014-04-27 DIAGNOSIS — I10 Essential (primary) hypertension: Secondary | ICD-10-CM | POA: Diagnosis not present

## 2014-05-11 DIAGNOSIS — Z0001 Encounter for general adult medical examination with abnormal findings: Secondary | ICD-10-CM | POA: Diagnosis not present

## 2014-06-09 DIAGNOSIS — S8010XA Contusion of unspecified lower leg, initial encounter: Secondary | ICD-10-CM | POA: Diagnosis not present

## 2014-06-09 DIAGNOSIS — I10 Essential (primary) hypertension: Secondary | ICD-10-CM | POA: Diagnosis not present

## 2014-06-09 DIAGNOSIS — Z6841 Body Mass Index (BMI) 40.0 and over, adult: Secondary | ICD-10-CM | POA: Diagnosis not present

## 2014-07-21 DIAGNOSIS — D3131 Benign neoplasm of right choroid: Secondary | ICD-10-CM | POA: Diagnosis not present

## 2014-07-21 DIAGNOSIS — H5203 Hypermetropia, bilateral: Secondary | ICD-10-CM | POA: Diagnosis not present

## 2014-07-21 DIAGNOSIS — H2513 Age-related nuclear cataract, bilateral: Secondary | ICD-10-CM | POA: Diagnosis not present

## 2014-07-21 DIAGNOSIS — H04123 Dry eye syndrome of bilateral lacrimal glands: Secondary | ICD-10-CM | POA: Diagnosis not present

## 2014-07-29 ENCOUNTER — Encounter: Payer: Self-pay | Admitting: Internal Medicine

## 2014-08-31 DIAGNOSIS — I10 Essential (primary) hypertension: Secondary | ICD-10-CM | POA: Diagnosis not present

## 2014-08-31 DIAGNOSIS — Z6841 Body Mass Index (BMI) 40.0 and over, adult: Secondary | ICD-10-CM | POA: Diagnosis not present

## 2014-08-31 DIAGNOSIS — R300 Dysuria: Secondary | ICD-10-CM | POA: Diagnosis not present

## 2014-08-31 DIAGNOSIS — Z1389 Encounter for screening for other disorder: Secondary | ICD-10-CM | POA: Diagnosis not present

## 2014-08-31 DIAGNOSIS — M1991 Primary osteoarthritis, unspecified site: Secondary | ICD-10-CM | POA: Diagnosis not present

## 2014-11-03 ENCOUNTER — Other Ambulatory Visit (HOSPITAL_COMMUNITY): Payer: Self-pay | Admitting: Internal Medicine

## 2014-11-03 DIAGNOSIS — Z1231 Encounter for screening mammogram for malignant neoplasm of breast: Secondary | ICD-10-CM

## 2014-11-18 ENCOUNTER — Ambulatory Visit (HOSPITAL_COMMUNITY)
Admission: RE | Admit: 2014-11-18 | Discharge: 2014-11-18 | Disposition: A | Discharge status: Home / Self Care | Payer: Medicare Other | Source: Ambulatory Visit | Attending: Internal Medicine | Admitting: Internal Medicine

## 2014-11-18 DIAGNOSIS — Z1231 Encounter for screening mammogram for malignant neoplasm of breast: Secondary | ICD-10-CM | POA: Diagnosis not present

## 2014-11-30 DIAGNOSIS — Z1389 Encounter for screening for other disorder: Secondary | ICD-10-CM | POA: Diagnosis not present

## 2014-11-30 DIAGNOSIS — R1032 Left lower quadrant pain: Secondary | ICD-10-CM | POA: Diagnosis not present

## 2014-11-30 DIAGNOSIS — Z6841 Body Mass Index (BMI) 40.0 and over, adult: Secondary | ICD-10-CM | POA: Diagnosis not present

## 2014-11-30 DIAGNOSIS — Z23 Encounter for immunization: Secondary | ICD-10-CM | POA: Diagnosis not present

## 2014-11-30 DIAGNOSIS — K5730 Diverticulosis of large intestine without perforation or abscess without bleeding: Secondary | ICD-10-CM | POA: Diagnosis not present

## 2014-12-07 ENCOUNTER — Ambulatory Visit (HOSPITAL_COMMUNITY)
Admission: RE | Admit: 2014-12-07 | Discharge: 2014-12-07 | Disposition: A | Discharge status: Home / Self Care | Payer: Medicare Other | Source: Ambulatory Visit | Attending: Family Medicine | Admitting: Family Medicine

## 2014-12-07 ENCOUNTER — Other Ambulatory Visit (HOSPITAL_COMMUNITY): Payer: Self-pay | Admitting: Family Medicine

## 2014-12-07 DIAGNOSIS — K439 Ventral hernia without obstruction or gangrene: Secondary | ICD-10-CM | POA: Diagnosis not present

## 2014-12-07 DIAGNOSIS — Z90710 Acquired absence of both cervix and uterus: Secondary | ICD-10-CM | POA: Insufficient documentation

## 2014-12-07 DIAGNOSIS — Z6841 Body Mass Index (BMI) 40.0 and over, adult: Secondary | ICD-10-CM | POA: Diagnosis not present

## 2014-12-07 DIAGNOSIS — R1032 Left lower quadrant pain: Secondary | ICD-10-CM | POA: Diagnosis not present

## 2014-12-07 DIAGNOSIS — Z1389 Encounter for screening for other disorder: Secondary | ICD-10-CM | POA: Diagnosis not present

## 2014-12-07 LAB — POCT I-STAT CREATININE: CREATININE: 0.9 mg/dL (ref 0.44–1.00)

## 2014-12-07 MED ORDER — IOHEXOL 300 MG/ML  SOLN
100.0000 mL | Freq: Once | INTRAMUSCULAR | Status: AC | PRN
  Administered 2014-12-07: 100 mL via INTRAVENOUS

## 2015-01-07 DIAGNOSIS — R109 Unspecified abdominal pain: Secondary | ICD-10-CM | POA: Diagnosis not present

## 2015-01-21 DIAGNOSIS — R109 Unspecified abdominal pain: Secondary | ICD-10-CM | POA: Diagnosis not present

## 2015-02-23 DIAGNOSIS — R109 Unspecified abdominal pain: Secondary | ICD-10-CM | POA: Diagnosis not present

## 2015-03-18 DIAGNOSIS — Z1389 Encounter for screening for other disorder: Secondary | ICD-10-CM | POA: Diagnosis not present

## 2015-03-18 DIAGNOSIS — K469 Unspecified abdominal hernia without obstruction or gangrene: Secondary | ICD-10-CM | POA: Diagnosis not present

## 2015-04-11 DIAGNOSIS — J209 Acute bronchitis, unspecified: Secondary | ICD-10-CM | POA: Diagnosis not present

## 2015-04-11 DIAGNOSIS — Z1389 Encounter for screening for other disorder: Secondary | ICD-10-CM | POA: Diagnosis not present

## 2015-04-11 DIAGNOSIS — J069 Acute upper respiratory infection, unspecified: Secondary | ICD-10-CM | POA: Diagnosis not present

## 2015-05-17 DIAGNOSIS — R7309 Other abnormal glucose: Secondary | ICD-10-CM | POA: Diagnosis not present

## 2015-05-17 DIAGNOSIS — E782 Mixed hyperlipidemia: Secondary | ICD-10-CM | POA: Diagnosis not present

## 2015-05-17 DIAGNOSIS — Z1389 Encounter for screening for other disorder: Secondary | ICD-10-CM | POA: Diagnosis not present

## 2015-05-17 DIAGNOSIS — Z0000 Encounter for general adult medical examination without abnormal findings: Secondary | ICD-10-CM | POA: Diagnosis not present

## 2015-05-17 DIAGNOSIS — I10 Essential (primary) hypertension: Secondary | ICD-10-CM | POA: Diagnosis not present

## 2015-07-28 DIAGNOSIS — H5203 Hypermetropia, bilateral: Secondary | ICD-10-CM | POA: Diagnosis not present

## 2015-07-28 DIAGNOSIS — H524 Presbyopia: Secondary | ICD-10-CM | POA: Diagnosis not present

## 2015-07-28 DIAGNOSIS — H25813 Combined forms of age-related cataract, bilateral: Secondary | ICD-10-CM | POA: Diagnosis not present

## 2015-09-23 DIAGNOSIS — Z1231 Encounter for screening mammogram for malignant neoplasm of breast: Secondary | ICD-10-CM | POA: Diagnosis not present

## 2015-10-05 DIAGNOSIS — Z1389 Encounter for screening for other disorder: Secondary | ICD-10-CM | POA: Diagnosis not present

## 2015-10-05 DIAGNOSIS — M545 Low back pain: Secondary | ICD-10-CM | POA: Diagnosis not present

## 2015-10-05 DIAGNOSIS — I10 Essential (primary) hypertension: Secondary | ICD-10-CM | POA: Diagnosis not present

## 2015-10-05 DIAGNOSIS — R81 Glycosuria: Secondary | ICD-10-CM | POA: Diagnosis not present

## 2015-10-05 DIAGNOSIS — K219 Gastro-esophageal reflux disease without esophagitis: Secondary | ICD-10-CM | POA: Diagnosis not present

## 2016-05-17 DIAGNOSIS — Z0000 Encounter for general adult medical examination without abnormal findings: Secondary | ICD-10-CM | POA: Diagnosis not present

## 2016-05-17 DIAGNOSIS — I10 Essential (primary) hypertension: Secondary | ICD-10-CM | POA: Diagnosis not present

## 2016-05-17 DIAGNOSIS — Z0001 Encounter for general adult medical examination with abnormal findings: Secondary | ICD-10-CM | POA: Diagnosis not present

## 2016-05-17 DIAGNOSIS — Z1389 Encounter for screening for other disorder: Secondary | ICD-10-CM | POA: Diagnosis not present

## 2016-05-17 DIAGNOSIS — E782 Mixed hyperlipidemia: Secondary | ICD-10-CM | POA: Diagnosis not present

## 2016-05-17 DIAGNOSIS — R600 Localized edema: Secondary | ICD-10-CM | POA: Diagnosis not present

## 2016-07-12 DIAGNOSIS — Z1389 Encounter for screening for other disorder: Secondary | ICD-10-CM | POA: Diagnosis not present

## 2016-07-12 DIAGNOSIS — N342 Other urethritis: Secondary | ICD-10-CM | POA: Diagnosis not present

## 2016-07-12 DIAGNOSIS — R350 Frequency of micturition: Secondary | ICD-10-CM | POA: Diagnosis not present

## 2016-07-12 DIAGNOSIS — N390 Urinary tract infection, site not specified: Secondary | ICD-10-CM | POA: Diagnosis not present

## 2016-08-06 DIAGNOSIS — H2512 Age-related nuclear cataract, left eye: Secondary | ICD-10-CM | POA: Diagnosis not present

## 2016-08-06 DIAGNOSIS — H04123 Dry eye syndrome of bilateral lacrimal glands: Secondary | ICD-10-CM | POA: Diagnosis not present

## 2016-08-06 DIAGNOSIS — H2511 Age-related nuclear cataract, right eye: Secondary | ICD-10-CM | POA: Diagnosis not present

## 2016-08-15 DIAGNOSIS — H2510 Age-related nuclear cataract, unspecified eye: Secondary | ICD-10-CM | POA: Insufficient documentation

## 2016-08-15 DIAGNOSIS — H2512 Age-related nuclear cataract, left eye: Secondary | ICD-10-CM | POA: Diagnosis not present

## 2016-08-29 DIAGNOSIS — H59021 Cataract (lens) fragments in eye following cataract surgery, right eye: Secondary | ICD-10-CM | POA: Diagnosis not present

## 2016-08-29 DIAGNOSIS — H2511 Age-related nuclear cataract, right eye: Secondary | ICD-10-CM | POA: Diagnosis not present

## 2016-09-28 DIAGNOSIS — H52223 Regular astigmatism, bilateral: Secondary | ICD-10-CM | POA: Diagnosis not present

## 2016-09-28 DIAGNOSIS — Z961 Presence of intraocular lens: Secondary | ICD-10-CM | POA: Diagnosis not present

## 2016-10-09 DIAGNOSIS — Z1231 Encounter for screening mammogram for malignant neoplasm of breast: Secondary | ICD-10-CM | POA: Diagnosis not present

## 2016-10-11 DIAGNOSIS — H59031 Cystoid macular edema following cataract surgery, right eye: Secondary | ICD-10-CM | POA: Diagnosis not present

## 2016-11-01 DIAGNOSIS — H59031 Cystoid macular edema following cataract surgery, right eye: Secondary | ICD-10-CM | POA: Diagnosis not present

## 2016-11-21 DIAGNOSIS — Z23 Encounter for immunization: Secondary | ICD-10-CM | POA: Diagnosis not present

## 2016-11-28 DIAGNOSIS — H02413 Mechanical ptosis of bilateral eyelids: Secondary | ICD-10-CM | POA: Diagnosis not present

## 2016-12-03 DIAGNOSIS — H02413 Mechanical ptosis of bilateral eyelids: Secondary | ICD-10-CM | POA: Diagnosis not present

## 2016-12-06 DIAGNOSIS — H59031 Cystoid macular edema following cataract surgery, right eye: Secondary | ICD-10-CM | POA: Diagnosis not present

## 2016-12-31 DIAGNOSIS — H02413 Mechanical ptosis of bilateral eyelids: Secondary | ICD-10-CM | POA: Diagnosis not present

## 2016-12-31 DIAGNOSIS — H02831 Dermatochalasis of right upper eyelid: Secondary | ICD-10-CM | POA: Diagnosis not present

## 2016-12-31 DIAGNOSIS — H02412 Mechanical ptosis of left eyelid: Secondary | ICD-10-CM | POA: Diagnosis not present

## 2016-12-31 DIAGNOSIS — H02834 Dermatochalasis of left upper eyelid: Secondary | ICD-10-CM | POA: Diagnosis not present

## 2016-12-31 DIAGNOSIS — H02403 Unspecified ptosis of bilateral eyelids: Secondary | ICD-10-CM | POA: Diagnosis not present

## 2016-12-31 DIAGNOSIS — H02411 Mechanical ptosis of right eyelid: Secondary | ICD-10-CM | POA: Diagnosis not present

## 2017-03-21 DIAGNOSIS — N342 Other urethritis: Secondary | ICD-10-CM | POA: Diagnosis not present

## 2017-03-21 DIAGNOSIS — R350 Frequency of micturition: Secondary | ICD-10-CM | POA: Diagnosis not present

## 2017-05-20 DIAGNOSIS — E782 Mixed hyperlipidemia: Secondary | ICD-10-CM | POA: Diagnosis not present

## 2017-05-20 DIAGNOSIS — Z1389 Encounter for screening for other disorder: Secondary | ICD-10-CM | POA: Diagnosis not present

## 2017-05-20 DIAGNOSIS — K219 Gastro-esophageal reflux disease without esophagitis: Secondary | ICD-10-CM | POA: Diagnosis not present

## 2017-05-20 DIAGNOSIS — M159 Polyosteoarthritis, unspecified: Secondary | ICD-10-CM | POA: Diagnosis not present

## 2017-05-20 DIAGNOSIS — R7309 Other abnormal glucose: Secondary | ICD-10-CM | POA: Diagnosis not present

## 2017-05-20 DIAGNOSIS — Z0001 Encounter for general adult medical examination with abnormal findings: Secondary | ICD-10-CM | POA: Diagnosis not present

## 2017-06-24 DIAGNOSIS — R7309 Other abnormal glucose: Secondary | ICD-10-CM | POA: Diagnosis not present

## 2017-06-24 DIAGNOSIS — R109 Unspecified abdominal pain: Secondary | ICD-10-CM | POA: Diagnosis not present

## 2017-06-24 DIAGNOSIS — Z1389 Encounter for screening for other disorder: Secondary | ICD-10-CM | POA: Diagnosis not present

## 2017-07-24 DIAGNOSIS — I10 Essential (primary) hypertension: Secondary | ICD-10-CM | POA: Diagnosis not present

## 2017-10-02 DIAGNOSIS — H04123 Dry eye syndrome of bilateral lacrimal glands: Secondary | ICD-10-CM | POA: Diagnosis not present

## 2017-10-02 DIAGNOSIS — H59031 Cystoid macular edema following cataract surgery, right eye: Secondary | ICD-10-CM | POA: Diagnosis not present

## 2017-10-10 DIAGNOSIS — Z1231 Encounter for screening mammogram for malignant neoplasm of breast: Secondary | ICD-10-CM | POA: Diagnosis not present

## 2017-10-23 DIAGNOSIS — M1711 Unilateral primary osteoarthritis, right knee: Secondary | ICD-10-CM | POA: Diagnosis not present

## 2017-10-23 DIAGNOSIS — Z96652 Presence of left artificial knee joint: Secondary | ICD-10-CM | POA: Insufficient documentation

## 2017-10-23 DIAGNOSIS — M1712 Unilateral primary osteoarthritis, left knee: Secondary | ICD-10-CM | POA: Diagnosis not present

## 2017-11-11 DIAGNOSIS — Z23 Encounter for immunization: Secondary | ICD-10-CM | POA: Diagnosis not present

## 2017-12-18 DIAGNOSIS — R7309 Other abnormal glucose: Secondary | ICD-10-CM | POA: Diagnosis not present

## 2017-12-18 DIAGNOSIS — Z1389 Encounter for screening for other disorder: Secondary | ICD-10-CM | POA: Diagnosis not present

## 2017-12-18 DIAGNOSIS — I10 Essential (primary) hypertension: Secondary | ICD-10-CM | POA: Diagnosis not present

## 2017-12-18 DIAGNOSIS — E782 Mixed hyperlipidemia: Secondary | ICD-10-CM | POA: Diagnosis not present

## 2018-03-20 DIAGNOSIS — J069 Acute upper respiratory infection, unspecified: Secondary | ICD-10-CM | POA: Diagnosis not present

## 2018-03-20 DIAGNOSIS — Z1389 Encounter for screening for other disorder: Secondary | ICD-10-CM | POA: Diagnosis not present

## 2018-07-16 DIAGNOSIS — Z1389 Encounter for screening for other disorder: Secondary | ICD-10-CM | POA: Diagnosis not present

## 2018-07-16 DIAGNOSIS — Z0001 Encounter for general adult medical examination with abnormal findings: Secondary | ICD-10-CM | POA: Diagnosis not present

## 2018-07-16 DIAGNOSIS — R7309 Other abnormal glucose: Secondary | ICD-10-CM | POA: Diagnosis not present

## 2018-07-16 DIAGNOSIS — E7849 Other hyperlipidemia: Secondary | ICD-10-CM | POA: Diagnosis not present

## 2018-07-16 DIAGNOSIS — I10 Essential (primary) hypertension: Secondary | ICD-10-CM | POA: Diagnosis not present

## 2018-08-13 DIAGNOSIS — R7309 Other abnormal glucose: Secondary | ICD-10-CM | POA: Diagnosis not present

## 2018-08-13 DIAGNOSIS — Z1389 Encounter for screening for other disorder: Secondary | ICD-10-CM | POA: Diagnosis not present

## 2018-08-13 DIAGNOSIS — E7849 Other hyperlipidemia: Secondary | ICD-10-CM | POA: Diagnosis not present

## 2018-08-13 DIAGNOSIS — I10 Essential (primary) hypertension: Secondary | ICD-10-CM | POA: Diagnosis not present

## 2018-10-24 DIAGNOSIS — Z1231 Encounter for screening mammogram for malignant neoplasm of breast: Secondary | ICD-10-CM | POA: Diagnosis not present

## 2018-10-28 DIAGNOSIS — Z23 Encounter for immunization: Secondary | ICD-10-CM | POA: Diagnosis not present

## 2018-11-11 DIAGNOSIS — B351 Tinea unguium: Secondary | ICD-10-CM | POA: Diagnosis not present

## 2018-11-11 DIAGNOSIS — L989 Disorder of the skin and subcutaneous tissue, unspecified: Secondary | ICD-10-CM | POA: Diagnosis not present

## 2018-11-20 DIAGNOSIS — H04123 Dry eye syndrome of bilateral lacrimal glands: Secondary | ICD-10-CM | POA: Diagnosis not present

## 2018-11-20 DIAGNOSIS — D3131 Benign neoplasm of right choroid: Secondary | ICD-10-CM | POA: Diagnosis not present

## 2019-01-19 DIAGNOSIS — E7849 Other hyperlipidemia: Secondary | ICD-10-CM | POA: Diagnosis not present

## 2019-01-19 DIAGNOSIS — I10 Essential (primary) hypertension: Secondary | ICD-10-CM | POA: Diagnosis not present

## 2019-02-19 DIAGNOSIS — E7849 Other hyperlipidemia: Secondary | ICD-10-CM | POA: Diagnosis not present

## 2019-02-19 DIAGNOSIS — I10 Essential (primary) hypertension: Secondary | ICD-10-CM | POA: Diagnosis not present

## 2019-03-22 DIAGNOSIS — I10 Essential (primary) hypertension: Secondary | ICD-10-CM | POA: Diagnosis not present

## 2019-03-22 DIAGNOSIS — E7849 Other hyperlipidemia: Secondary | ICD-10-CM | POA: Diagnosis not present

## 2019-04-19 DIAGNOSIS — E7849 Other hyperlipidemia: Secondary | ICD-10-CM | POA: Diagnosis not present

## 2019-04-19 DIAGNOSIS — I10 Essential (primary) hypertension: Secondary | ICD-10-CM | POA: Diagnosis not present

## 2019-05-20 DIAGNOSIS — E7849 Other hyperlipidemia: Secondary | ICD-10-CM | POA: Diagnosis not present

## 2019-05-20 DIAGNOSIS — I10 Essential (primary) hypertension: Secondary | ICD-10-CM | POA: Diagnosis not present

## 2019-06-01 DIAGNOSIS — K149 Disease of tongue, unspecified: Secondary | ICD-10-CM | POA: Diagnosis not present

## 2019-06-01 DIAGNOSIS — R7309 Other abnormal glucose: Secondary | ICD-10-CM | POA: Diagnosis not present

## 2019-06-01 DIAGNOSIS — E782 Mixed hyperlipidemia: Secondary | ICD-10-CM | POA: Diagnosis not present

## 2019-06-01 DIAGNOSIS — I10 Essential (primary) hypertension: Secondary | ICD-10-CM | POA: Diagnosis not present

## 2019-06-01 DIAGNOSIS — Z1389 Encounter for screening for other disorder: Secondary | ICD-10-CM | POA: Diagnosis not present

## 2019-06-01 DIAGNOSIS — Z0000 Encounter for general adult medical examination without abnormal findings: Secondary | ICD-10-CM | POA: Diagnosis not present

## 2019-06-01 DIAGNOSIS — E7849 Other hyperlipidemia: Secondary | ICD-10-CM | POA: Diagnosis not present

## 2019-06-19 DIAGNOSIS — I10 Essential (primary) hypertension: Secondary | ICD-10-CM | POA: Diagnosis not present

## 2019-06-19 DIAGNOSIS — K219 Gastro-esophageal reflux disease without esophagitis: Secondary | ICD-10-CM | POA: Diagnosis not present

## 2019-06-19 DIAGNOSIS — E7849 Other hyperlipidemia: Secondary | ICD-10-CM | POA: Diagnosis not present

## 2019-06-19 DIAGNOSIS — R7309 Other abnormal glucose: Secondary | ICD-10-CM | POA: Diagnosis not present

## 2019-07-16 ENCOUNTER — Other Ambulatory Visit: Payer: Self-pay

## 2019-07-16 ENCOUNTER — Other Ambulatory Visit (HOSPITAL_COMMUNITY): Payer: Self-pay | Admitting: Physician Assistant

## 2019-07-16 ENCOUNTER — Ambulatory Visit (HOSPITAL_COMMUNITY)
Admission: RE | Admit: 2019-07-16 | Discharge: 2019-07-16 | Disposition: A | Discharge status: Home / Self Care | Payer: Medicare Other | Source: Ambulatory Visit | Attending: Physician Assistant | Admitting: Physician Assistant

## 2019-07-16 DIAGNOSIS — Z23 Encounter for immunization: Secondary | ICD-10-CM | POA: Diagnosis not present

## 2019-07-16 DIAGNOSIS — M79672 Pain in left foot: Secondary | ICD-10-CM | POA: Diagnosis not present

## 2019-07-16 DIAGNOSIS — M7989 Other specified soft tissue disorders: Secondary | ICD-10-CM | POA: Diagnosis not present

## 2019-07-16 DIAGNOSIS — R1310 Dysphagia, unspecified: Secondary | ICD-10-CM | POA: Diagnosis not present

## 2019-07-16 DIAGNOSIS — M25572 Pain in left ankle and joints of left foot: Secondary | ICD-10-CM | POA: Diagnosis not present

## 2019-07-24 ENCOUNTER — Encounter: Payer: Self-pay | Admitting: Internal Medicine

## 2019-09-16 ENCOUNTER — Ambulatory Visit: Payer: Medicare Other | Admitting: Internal Medicine

## 2019-09-16 ENCOUNTER — Encounter: Payer: Self-pay | Admitting: Internal Medicine

## 2019-09-16 VITALS — BP 140/90 | HR 80 | Ht 65.0 in | Wt 270.1 lb

## 2019-09-16 DIAGNOSIS — K219 Gastro-esophageal reflux disease without esophagitis: Secondary | ICD-10-CM

## 2019-09-16 DIAGNOSIS — R1310 Dysphagia, unspecified: Secondary | ICD-10-CM

## 2019-09-16 NOTE — Progress Notes (Signed)
HISTORY OF PRESENT ILLNESS:  Emily Andersen is a pleasant 64 y.o. female, retired [employer], who I have seen in the past for screening colonoscopy.  Her last colonoscopy in 2014 revealed mild sigmoid diverticulosis but was otherwise normal.  Follow-up in 10 years recommended.  She does have a history of chronic GERD for which she takes Prilosec OTC 20 mg daily.  The most part, this controls symptoms.  She is sent today by her primary care provider, Dr. Sharilyn Sites, regarding a new complaint of dysphagia.  Patient reports a 22-month history of intermittent solid food dysphagia items such as bread and peanut butter.  No weight loss.  Nothing that sounds like transient food impaction.  No vomiting.  She has not had prior upper endoscopy.  Review of x-ray file laboratory files reveals no relevant abnormalities.  She has completed her Covid vaccination series.  REVIEW OF SYSTEMS:  All non-GI ROS negative unless otherwise stated in the HPI except for anxiety, arthritis, back pain, ankle swelling, urinary leakage  Past Medical History:  Diagnosis Date  . Anemia   . Arthritis   . GERD (gastroesophageal reflux disease)   . Hyperlipidemia   . Hypertension   . Obesity   . Uterine fibroid     Past Surgical History:  Procedure Laterality Date  . ABDOMINAL HYSTERECTOMY  1974  . COLONOSCOPY    . TOTAL KNEE ARTHROPLASTY Left 2006  . UMBILICAL HERNIA REPAIR  2000    Social History Emily Andersen  reports that she has never smoked. She has never used smokeless tobacco. She reports that she does not drink alcohol and does not use drugs.  family history includes Alzheimer's disease in her mother; Breast cancer in her sister; Cancer in her maternal grandmother; Colon polyps in her sister; Emphysema in her brother and sister; Heart disease in her father.  No Known Allergies     PHYSICAL EXAMINATION: Vital signs: BP (!) 140/90 (BP Location: Left Arm, Patient Position: Sitting, Cuff Size:  Normal)   Pulse 80   Ht 5\' 5"  (1.651 m) Comment: height measured without shoes  Wt (!) 270 lb 2 oz (122.5 kg)   BMI 44.95 kg/m   Constitutional: generally well-appearing but obese, no acute distress Psychiatric: alert and oriented x3, cooperative Eyes: extraocular movements intact, anicteric, conjunctiva pink Mouth: oral pharynx moist, no lesions Neck: supple no lymphadenopathy Cardiovascular: heart regular rate and rhythm, no murmur Lungs: clear to auscultation bilaterally Abdomen: soft, obese, nontender, nondistended, no obvious ascites, no peritoneal signs, normal bowel sounds, no organomegaly.  Prior surgical incisions well-healed Rectal: Omitted Extremities: no clubbing or cyanosis.  1+ lower extremity edema bilaterally Skin: no lesions on visible extremities Neuro: No focal deficits.  Cranial nerves intact  ASSESSMENT:  1.  Chronic GERD.  On Prilosec OTC 20 mg daily with occasional breakthrough 2.  7-month history of intermittent solid food dysphagia.  Rule out peptic stricture 3.  Morbid obesity 4.  Screening colonoscopy 2004 and 2014 both negative for neoplasia   PLAN:  1.  Schedule upper endoscopy with possible esophageal dilation.  The patient is HIGH RISK given her body habitus.  As such, examination will be performed with monitored anesthesia care.The nature of the procedure, as well as the risks, benefits, and alternatives were carefully and thoroughly reviewed with the patient. Ample time for discussion and questions allowed. The patient understood, was satisfied, and agreed to proceed. 2.  Reflux precautions with attention to weight loss 3.  Continue Prilosec OTC 20 mg  daily.  May need to adjust dosage based on endoscopic findings 4.  Routine screening colonoscopy 2024

## 2019-09-16 NOTE — Patient Instructions (Signed)
You have been scheduled for an endoscopy. Please follow written instructions given to you at your visit today. If you use inhalers (even only as needed), please bring them with you on the day of your procedure.   

## 2019-09-18 ENCOUNTER — Other Ambulatory Visit: Payer: Self-pay

## 2019-09-18 ENCOUNTER — Ambulatory Visit (AMBULATORY_SURGERY_CENTER): Payer: Medicare Other | Admitting: Internal Medicine

## 2019-09-18 ENCOUNTER — Encounter: Payer: Self-pay | Admitting: Internal Medicine

## 2019-09-18 VITALS — BP 139/78 | HR 51 | Temp 95.5°F | Resp 17 | Ht 65.0 in | Wt 270.0 lb

## 2019-09-18 DIAGNOSIS — K317 Polyp of stomach and duodenum: Secondary | ICD-10-CM | POA: Diagnosis not present

## 2019-09-18 DIAGNOSIS — K449 Diaphragmatic hernia without obstruction or gangrene: Secondary | ICD-10-CM | POA: Diagnosis not present

## 2019-09-18 DIAGNOSIS — R1310 Dysphagia, unspecified: Secondary | ICD-10-CM

## 2019-09-18 DIAGNOSIS — K219 Gastro-esophageal reflux disease without esophagitis: Secondary | ICD-10-CM | POA: Diagnosis not present

## 2019-09-18 MED ORDER — SODIUM CHLORIDE 0.9 % IV SOLN: 500.0000 mL | Freq: Once | INTRAVENOUS | Status: DC

## 2019-09-18 NOTE — Progress Notes (Signed)
VS by CW. ?

## 2019-09-18 NOTE — Progress Notes (Signed)
No problems noted in the recovery room. maw 

## 2019-09-18 NOTE — Progress Notes (Signed)
pt tolerated well. VSS. awake and to recovery. Report given to RN.  

## 2019-09-18 NOTE — Patient Instructions (Addendum)
Handouts were given to you on the esophageal dilatation diet to follow the rest of the day and a Hiatal Hernia. You may resume your current medications today.  Make sure to take your PRILOSEC OTC 20 mg every day per Dr. Henrene Pastor.  Best to take 20 - 30 minutes before breakfast on an empty stomach. The office will call you to set up an appointment to see Dr. Henrene Pastor in the office for 6 - 8 weeks. Please call if any questions or concerns.     YOU HAD AN ENDOSCOPIC PROCEDURE TODAY AT [facility]:   Refer to the procedure report that was given to you for any specific questions about what was found during the examination.  If the procedure report does not answer your questions, please call your gastroenterologist to clarify.  If you requested that your care partner not be given the details of your procedure findings, then the procedure report has been included in a sealed envelope for you to review at your convenience later.  YOU SHOULD EXPECT: Some feelings of bloating in the abdomen. Passage of more gas than usual.  Walking can help get rid of the air that was put into your GI tract during the procedure and reduce the bloating. If you had a lower endoscopy (such as a colonoscopy or flexible sigmoidoscopy) you may notice spotting of blood in your stool or on the toilet paper. If you underwent a bowel prep for your procedure, you may not have a normal bowel movement for a few days.  Please Note:  You might notice some irritation and congestion in your nose or some drainage.  This is from the oxygen used during your procedure.  There is no need for concern and it should clear up in a day or so.  SYMPTOMS TO REPORT IMMEDIATELY:    Following upper endoscopy (EGD)  Vomiting of blood or coffee ground material  New chest pain or pain under the shoulder blades  Painful or persistently difficult swallowing  New shortness of breath  Fever of 100F or higher  Black, tarry-looking stools  For  urgent or emergent issues, a gastroenterologist can be reached at any hour by calling [phone]. Do not use MyChart messaging for urgent concerns.    DIET:  Please follow the esophageal dilatation the rest of today.  Handout was attached.  Drink plenty of fluids but you should avoid alcoholic beverages for 24 hours.  ACTIVITY:  You should plan to take it easy for the rest of today and you should NOT DRIVE or use heavy machinery until tomorrow (because of the sedation medicines used during the test).    FOLLOW UP: Our staff will call the number listed on your records 48-72 hours following your procedure to check on you and address any questions or concerns that you may have regarding the information given to you following your procedure. If we do not reach you, we will leave a message.  We will attempt to reach you two times.  During this call, we will ask if you have developed any symptoms of COVID 19. If you develop any symptoms (ie: fever, flu-like symptoms, shortness of breath, cough etc.) before then, please call [phone].  If you test positive for Covid 19 in the 2 weeks post procedure, please call and report this information to [place].    If any biopsies were taken you will be contacted by phone or by letter within the next 1-3 weeks.  Please call us at [phone] if you have not heard about the biopsies in 3 weeks.    SIGNATURES/CONFIDENTIALITY: You and/or your care partner have signed paperwork which will be entered into your electronic medical record.  These signatures attest to the fact that that the information above on your After Visit Summary has been reviewed and is understood.  Full responsibility of the confidentiality of this discharge information lies with you and/or your care-partner.

## 2019-09-18 NOTE — Progress Notes (Signed)
Called to room to assist during endoscopic procedure.  Patient ID and intended procedure confirmed with present staff. Received instructions for my participation in the procedure from the performing physician.  

## 2019-09-18 NOTE — Op Note (Signed)
[facility] Patient Name: Emily Andersen Procedure Date: 09/18/2019 10:36 AM MRN: [id number] Endoscopist: Docia Chuck. Henrene Pastor , MD Age: 64 Referring MD:  Date of Birth: 01-Aug-1955 Gender: Female Account #: [id number] Procedure:                Upper GI endoscopy with [facility] dilation of the                            esophagus. [address] Indications:              Dysphagia Medicines:                Monitored Anesthesia Care Procedure:                Pre-Anesthesia Assessment:                           - Prior to the procedure, a History and Physical                            was performed, and patient medications and                            allergies were reviewed. The patient's tolerance of                            previous anesthesia was also reviewed. The risks                            and benefits of the procedure and the sedation                            options and risks were discussed with the patient.                            All questions were answered, and informed consent                            was obtained. Prior Anticoagulants: The patient has                            taken no previous anticoagulant or antiplatelet                            agents. ASA Grade Assessment: II - A patient with                            mild systemic disease. After reviewing the risks                            and benefits, the patient was deemed in                            satisfactory condition to undergo the procedure.  After obtaining informed consent, the endoscope was                            passed under direct vision. Throughout the                            procedure, the patient's blood pressure, pulse, and                            oxygen saturations were monitored continuously. The                            Endoscope was introduced through the mouth, and                            advanced to the second part of duodenum.  The upper                            GI endoscopy was accomplished without difficulty.                            The patient tolerated the procedure well. Scope In: Scope Out: Findings:                 The esophagus was normal. The scope was withdrawn.                            Dilation was performed with a Maloney dilator with                            no resistance at 87 Fr.                           The stomach was normal, save a few small benign                            fundic gland type polyps. Small hiatal hernia.                           The examined duodenum was normal.                           The cardia and gastric fundus were normal on                            retroflexion. Complications:            No immediate complications. Estimated Blood Loss:     Estimated blood loss: none. Impression:               - Normal esophagus. Dilated.                           - Normal stomach, save incidental benign fundic  gland type polyps. Small hiatal hernia.                           - Normal examined duodenum.                           - No specimens collected. Recommendation:           - Patient has a contact number available for                            emergencies. The signs and symptoms of potential                            delayed complications were discussed with the                            patient. Return to normal activities tomorrow.                            Written discharge instructions were provided to the                            patient.                           - Resume previous diet.                           - Continue present medications. Take your Prilosec                            OTC 20 mg every day                           -Follow-up in the office with Dr. Henrene Pastor in about 6                            to 8 weeks Docia Chuck. Henrene Pastor, MD 09/18/2019 10:56:58 AM This report has been signed electronically.

## 2019-09-22 ENCOUNTER — Telehealth: Payer: Self-pay | Admitting: *Deleted

## 2019-09-22 NOTE — Telephone Encounter (Signed)
  Follow up Call-  Call back number 09/18/2019  Post procedure Call Back phone  # [phone]  Permission to leave phone message Yes  Some recent data might be hidden     Patient questions:  Do you have a fever, pain , or abdominal swelling? No. Pain Score  0 *  Have you tolerated food without any problems? Yes.    Have you been able to return to your normal activities? Yes.    Do you have any questions about your discharge instructions: Diet   No. Medications  No. Follow up visit  No.  Do you have questions or concerns about your Care? No.  Actions: * If pain score is 4 or above: No action needed, pain <4. 1. Have you developed a fever since your procedure? no  2.   Have you had an respiratory symptoms (SOB or cough) since your procedure? no  3.   Have you tested positive for COVID 19 since your procedure no  4.   Have you had any family members/close contacts diagnosed with the COVID 19 since your procedure?  no   If yes to any of these questions please route to Joylene John, RN and Erenest Rasher, RN

## 2019-10-20 DIAGNOSIS — I10 Essential (primary) hypertension: Secondary | ICD-10-CM | POA: Diagnosis not present

## 2019-10-20 DIAGNOSIS — E7849 Other hyperlipidemia: Secondary | ICD-10-CM | POA: Diagnosis not present

## 2019-10-20 DIAGNOSIS — K219 Gastro-esophageal reflux disease without esophagitis: Secondary | ICD-10-CM | POA: Diagnosis not present

## 2019-10-30 DIAGNOSIS — Z1231 Encounter for screening mammogram for malignant neoplasm of breast: Secondary | ICD-10-CM | POA: Diagnosis not present

## 2019-11-05 DIAGNOSIS — M1711 Unilateral primary osteoarthritis, right knee: Secondary | ICD-10-CM | POA: Diagnosis not present

## 2019-11-05 DIAGNOSIS — M21161 Varus deformity, not elsewhere classified, right knee: Secondary | ICD-10-CM | POA: Diagnosis not present

## 2019-11-05 DIAGNOSIS — M25361 Other instability, right knee: Secondary | ICD-10-CM | POA: Diagnosis not present

## 2019-11-05 DIAGNOSIS — M25561 Pain in right knee: Secondary | ICD-10-CM | POA: Diagnosis not present

## 2019-11-09 DIAGNOSIS — Z23 Encounter for immunization: Secondary | ICD-10-CM | POA: Diagnosis not present

## 2019-11-12 DIAGNOSIS — M1711 Unilateral primary osteoarthritis, right knee: Secondary | ICD-10-CM | POA: Diagnosis not present

## 2019-11-12 DIAGNOSIS — M25561 Pain in right knee: Secondary | ICD-10-CM | POA: Diagnosis not present

## 2019-11-18 ENCOUNTER — Ambulatory Visit (INDEPENDENT_AMBULATORY_CARE_PROVIDER_SITE_OTHER): Payer: Medicare Other | Admitting: Internal Medicine

## 2019-11-18 ENCOUNTER — Encounter: Payer: Self-pay | Admitting: Internal Medicine

## 2019-11-18 VITALS — BP 122/80 | HR 60 | Ht 66.0 in | Wt 265.1 lb

## 2019-11-18 DIAGNOSIS — R1310 Dysphagia, unspecified: Secondary | ICD-10-CM | POA: Diagnosis not present

## 2019-11-18 DIAGNOSIS — K219 Gastro-esophageal reflux disease without esophagitis: Secondary | ICD-10-CM

## 2019-11-18 NOTE — Patient Instructions (Signed)
Please follow up in one year 

## 2019-11-18 NOTE — Progress Notes (Signed)
  HISTORY OF PRESENT ILLNESS:  Emily Andersen is a 64 y.o. female, retired [employer], who was evaluated in the office September 16, 2019 regarding chronic GERD and intermittent solid food dysphagia.  See that dictation.  She subsequently underwent upper endoscopy September 18, 2019.  Esophagus was grossly normal but empirically dilated with 54 French Maloney dilator.  The remainder of the exam was remarkable for benign fundic gland type polyps.  She was encouraged to take Prilosec OTC 20 mg DAILY (as she was having active symptoms on sporadic usage).  She presents today for follow-up.  She is pleased to report that she is having no reflux symptoms.  Furthermore, note ongoing issues with dysphagia.  No new complaints.  She has completed her Covid vaccination series  REVIEW OF SYSTEMS:  All non-GI ROS negative unless otherwise stated in the HPI except for arthritis  Past Medical History:  Diagnosis Date  . Anemia   . Arthritis   . GERD (gastroesophageal reflux disease)   . Hyperlipidemia   . Hypertension   . Obesity   . Uterine fibroid     Past Surgical History:  Procedure Laterality Date  . ABDOMINAL HYSTERECTOMY  1974  . COLONOSCOPY    . TOTAL KNEE ARTHROPLASTY Left 2006  . UMBILICAL HERNIA REPAIR  2000    Social History Emily Andersen  reports that she has never smoked. She has never used smokeless tobacco. She reports that she does not drink alcohol and does not use drugs.  family history includes Alzheimer's disease in her mother; Breast cancer in her sister; Cancer in her maternal grandmother; Colon polyps in her sister; Emphysema in her brother and sister; Heart disease in her father.  No Known Allergies     PHYSICAL EXAMINATION: Vital signs: BP 122/80 (BP Location: Right Arm, Patient Position: Sitting, Cuff Size: Large)   Pulse 60   Ht 5\' 6"  (1.676 m)   Wt 265 lb 2 oz (120.3 kg)   BMI 42.79 kg/m   Constitutional: generally well-appearing, no acute  distress Psychiatric: alert and oriented x3, cooperative Eyes: extraocular movements intact, anicteric, conjunctiva pink Mouth: oral pharynx moist, no lesions Neck: supple no lymphadenopathy Cardiovascular: heart regular rate and rhythm, no murmur Lungs: clear to auscultation bilaterally Abdomen: soft, obese, nontender, nondistended, no obvious ascites, no peritoneal signs, normal bowel sounds, no organomegaly Rectal: Omitted Extremities: no clubbing, cyanosis.  Trace lower extremity edema bilaterally Skin: no lesions on visible extremities Neuro: No focal deficits.  Cranial nerves intact  ASSESSMENT:  1.  Chronic GERD.  Symptoms improved with regular use of omeprazole daily 2.  Intermittent solid food dysphagia.  Normal esophagus on EGD.  Dysphagia improved after empiric dilation with 54 French Maloney dilator.  May have had subtle ring or web not appreciated at the time of endoscopy 3.  Obesity 4.  Screening colonoscopy 2004 and 2014 both negative for neoplasia   PLAN:  1.  Reflux precautions 2.  Weight loss 3.  Continue omeprazole 20 mg daily.  Medication risks reviewed 4.  Screening colonoscopy 2024 5.  Routine office follow-up 1 year.  Contact the office in the interim for any questions or problems

## 2019-11-20 DIAGNOSIS — M25561 Pain in right knee: Secondary | ICD-10-CM | POA: Diagnosis not present

## 2019-11-20 DIAGNOSIS — M1711 Unilateral primary osteoarthritis, right knee: Secondary | ICD-10-CM | POA: Diagnosis not present

## 2019-11-26 DIAGNOSIS — D3131 Benign neoplasm of right choroid: Secondary | ICD-10-CM | POA: Diagnosis not present

## 2019-12-19 DIAGNOSIS — E7849 Other hyperlipidemia: Secondary | ICD-10-CM | POA: Diagnosis not present

## 2019-12-19 DIAGNOSIS — I10 Essential (primary) hypertension: Secondary | ICD-10-CM | POA: Diagnosis not present

## 2020-01-27 DIAGNOSIS — M1990 Unspecified osteoarthritis, unspecified site: Secondary | ICD-10-CM | POA: Insufficient documentation

## 2020-01-27 DIAGNOSIS — K219 Gastro-esophageal reflux disease without esophagitis: Secondary | ICD-10-CM | POA: Insufficient documentation

## 2020-01-29 DIAGNOSIS — T1582XA Foreign body in other and multiple parts of external eye, left eye, initial encounter: Secondary | ICD-10-CM | POA: Diagnosis not present

## 2020-01-29 DIAGNOSIS — H04123 Dry eye syndrome of bilateral lacrimal glands: Secondary | ICD-10-CM | POA: Diagnosis not present

## 2020-01-29 DIAGNOSIS — H57813 Brow ptosis, bilateral: Secondary | ICD-10-CM | POA: Diagnosis not present

## 2020-01-29 DIAGNOSIS — H04213 Epiphora due to excess lacrimation, bilateral lacrimal glands: Secondary | ICD-10-CM | POA: Diagnosis not present

## 2020-01-29 DIAGNOSIS — T1581XA Foreign body in other and multiple parts of external eye, right eye, initial encounter: Secondary | ICD-10-CM | POA: Diagnosis not present

## 2020-02-04 ENCOUNTER — Encounter (HOSPITAL_COMMUNITY): Payer: Self-pay

## 2020-02-04 ENCOUNTER — Ambulatory Visit (HOSPITAL_COMMUNITY)
Admission: EM | Admit: 2020-02-04 | Discharge: 2020-02-04 | Disposition: A | Discharge status: Home / Self Care | Payer: Medicare Other | Attending: Family Medicine | Admitting: Family Medicine

## 2020-02-04 ENCOUNTER — Other Ambulatory Visit: Payer: Self-pay

## 2020-02-04 DIAGNOSIS — M549 Dorsalgia, unspecified: Secondary | ICD-10-CM

## 2020-02-04 DIAGNOSIS — S46912A Strain of unspecified muscle, fascia and tendon at shoulder and upper arm level, left arm, initial encounter: Secondary | ICD-10-CM | POA: Diagnosis not present

## 2020-02-04 DIAGNOSIS — R079 Chest pain, unspecified: Secondary | ICD-10-CM | POA: Diagnosis not present

## 2020-02-04 MED ORDER — KETOROLAC TROMETHAMINE 60 MG/2ML IM SOLN
15.0000 mg | Freq: Once | INTRAMUSCULAR | Status: AC
  Administered 2020-02-04: 15 mg via INTRAMUSCULAR

## 2020-02-04 MED ORDER — DICLOFENAC SODIUM 1 % EX GEL: 2.0000 g | Freq: Four times a day (QID) | CUTANEOUS | 0 refills | Status: DC

## 2020-02-04 MED ORDER — KETOROLAC TROMETHAMINE 30 MG/ML IJ SOLN
INTRAMUSCULAR | Status: AC
  Filled 2020-02-04: qty 1

## 2020-02-04 MED ORDER — METHOCARBAMOL 500 MG PO TABS: 500.0000 mg | ORAL_TABLET | Freq: Three times a day (TID) | ORAL | 0 refills | Status: DC | PRN

## 2020-02-04 NOTE — ED Triage Notes (Signed)
Pt presents with constant ongoing left side chest pain; pt also complains of left side jaw pain that radiates down to shoulder & arm that sometimes leaves it feeling kind of numb X 3 days.

## 2020-02-04 NOTE — ED Provider Notes (Addendum)
[facility]    CSN: [id number] Arrival date & time: 02/04/20  1057      History   Chief Complaint Chief Complaint  Patient presents with  . Chest Pain  . Arm Pain/Numbness    HPI Emily Andersen is a 64 y.o. female with PMH of hypertension and hyperlipidemia presents to urgent care today with left-sided neck, shoulder and arm pain.  Patient states symptoms constant x4 days.  Symptoms better when sitting up and still and worse when trying to sleep at night, moving in certain positions and lifting left arm.  Patient states symptoms began shortly after helping her husband move a mattress.  She has taken Tylenol and Aleve with only temporary relief.  Patient denies any headache, dizziness, diaphoresis, palpitations, SOB, orthopnea, lower extremity edema weakness, fatigue, numbness or tingling.    Past Medical History:  Diagnosis Date  . Anemia   . Arthritis   . GERD (gastroesophageal reflux disease)   . Hyperlipidemia   . Hypertension   . Obesity   . Uterine fibroid     There are no problems to display for this patient.   Past Surgical History:  Procedure Laterality Date  . ABDOMINAL HYSTERECTOMY  1974  . COLONOSCOPY    . TOTAL KNEE ARTHROPLASTY Left 2006  . UMBILICAL HERNIA REPAIR  2000    OB History   No obstetric history on file.      Home Medications    Prior to Admission medications   Medication Sig Start Date End Date Taking? Authorizing Provider  amLODipine (NORVASC) 10 MG tablet Take 1 tablet by mouth at bedtime. 07/30/19   [provider]  aspirin 81 MG tablet Take 81 mg by mouth daily.    [provider]  Biotin 5 MG CAPS Take 1 capsule by mouth daily.    [provider]  Cholecalciferol (VITAMIN D3) 2000 UNITS TABS Take 2,000 Units by mouth daily.    [provider]  diclofenac Sodium (VOLTAREN) 1 % GEL Apply 2 g topically 4 (four) times daily. 02/04/20   Rudolpho Sevin, NP  escitalopram (LEXAPRO)  10 MG tablet Take 10 mg by mouth daily.    [provider]  lisinopril-hydrochlorothiazide (ZESTORETIC) 20-25 MG tablet Take 1 tablet by mouth daily.    [provider]  methocarbamol (ROBAXIN) 500 MG tablet Take 1 tablet (500 mg total) by mouth every 8 (eight) hours as needed for muscle spasms. 02/04/20   Rudolpho Sevin, NP  mirtazapine (REMERON) 15 MG tablet Take 15 mg by mouth at bedtime.    [provider]  Multiple Vitamins-Minerals (CENTRUM SILVER PO) Take by mouth daily.    [provider]  omeprazole (PRILOSEC OTC) 20 MG tablet Take 20 mg by mouth daily.    [provider]  pravastatin (PRAVACHOL) 20 MG tablet Take 20 mg by mouth daily.    [provider]  pyridOXINE (VITAMIN B-6) 100 MG tablet Take 100 mg by mouth daily.    [provider]  terbinafine (LAMISIL) 250 MG tablet Take 250 mg by mouth daily.    [provider]  vitamin B-12 (CYANOCOBALAMIN) 500 MCG tablet Take 500 mcg by mouth daily.    [provider]    Family History Family History  Problem Relation Age of Onset  . Heart disease Father   . Alzheimer's disease Mother   . Colon polyps Sister   . Breast cancer Sister   . Emphysema Sister  smoker  . Emphysema Brother        smoker  . Cancer Maternal Grandmother        type unknown    Social History Social History   Tobacco Use  . Smoking status: Never Smoker  . Smokeless tobacco: Never Used  Vaping Use  . Vaping Use: Never used  Substance Use Topics  . Alcohol use: No  . Drug use: No     Allergies   Patient has no known allergies.   Review of Systems As stated in HPI otherwise negative   Physical Exam Triage Vital Signs ED Triage Vitals [02/04/20 1104]  Enc Vitals Group     BP 136/80     Pulse Rate 85     Resp 17     Temp 98 F (36.7 C)     Temp Source Oral     SpO2 97 %     Weight      Height      Head Circumference      Peak Flow      Pain Score  6     Pain Loc      Pain Edu?      Excl. in [place]?    No data found.  Updated Vital Signs BP 136/80 (BP Location: Right Arm)   Pulse 85   Temp 98 F (36.7 C) (Oral)   Resp 17   SpO2 97%   Visual Acuity Right Eye Distance:   Left Eye Distance:   Bilateral Distance:    Right Eye Near:   Left Eye Near:    Bilateral Near:     Physical Exam Constitutional:      General: She is not in acute distress.    Appearance: She is well-developed. She is not ill-appearing or diaphoretic.  Cardiovascular:     Rate and Rhythm: Normal rate and regular rhythm.     Heart sounds: Normal heart sounds.     Comments: No carotid bruit, no lower extremity edema Pulmonary:     Effort: Pulmonary effort is normal. No tachypnea.     Breath sounds: Normal breath sounds.  Abdominal:     General: Bowel sounds are normal.     Palpations: Abdomen is soft.  Musculoskeletal:        General: Normal range of motion.     Cervical back: Normal range of motion and neck supple.     Comments: Significant tenderness upon palpation of left trapezius muscle.  No shoulder joint tenderness.  Normal range of motion, 5/5 strength  Skin:    General: Skin is warm and dry.  Neurological:     General: No focal deficit present.     Mental Status: She is alert and oriented to person, place, and time.     Motor: No weakness.  Psychiatric:        Mood and Affect: Mood normal.        Behavior: Behavior normal.      UC Treatments / Results  Labs (all labs ordered are listed, but only abnormal results are displayed) Labs Reviewed - No data to display  EKG NSR at 86bpm, no ischemic changes  Radiology No results found.  Procedures Procedures (including critical care time)  Medications Ordered in UC Medications  ketorolac (TORADOL) injection 15 mg (15 mg Intramuscular Given 02/04/20 1441)    Initial Impression / Assessment and Plan / UC Course  I have reviewed the triage vital signs and the nursing  notes.  Pertinent labs &  imaging results that were available during my care of the patient were reviewed by me and considered in my medical decision making (see chart for details).  Left shoulder, arm pain -low suspicious for ACS based on history and exam.  Pain easily reproducible and known mechanism of injury -EKG sinus rhythm at 86 bpm, no ischemic changes -Low-dose Toradol in office -Ibuprofen as needed, Votaren gel, Robaxin with precautions given, heat -Follow-up PCP next week -ER for any worsening pain or shortness of breath  Reviewed expections re: course of current medical issues. Questions answered. Outlined signs and symptoms indicating need for more acute intervention. Pt verbalized understanding. AVS given  Final Clinical Impressions(s) / UC Diagnoses   Final diagnoses:  Strain of left shoulder, initial encounter  Musculoskeletal back pain     Discharge Instructions     After speaking with you and exam I really feel like your symptoms are due to a muscle strain.  You have been given a shot of anti-inflammatory in the office today.  Do not take any other Motrin's, ibuprofens or Aleve until tomorrow.  Use muscle relaxer as needed for muscle tension.  This medication can make you sleepy so no driving after taking.  Use heating pad to affected area several times daily for 20 minutes at a time.  If pain should become worse or you should develop any shortness of breath, you will need to go immediately to the emergency department.  Please call  Dr. Delanna Ahmadi office to schedule follow-up from this visit    ED Prescriptions    Medication Sig Dispense Auth. Provider   methocarbamol (ROBAXIN) 500 MG tablet Take 1 tablet (500 mg total) by mouth every 8 (eight) hours as needed for muscle spasms. 30 tablet Rudolpho Sevin, NP   diclofenac Sodium (VOLTAREN) 1 % GEL Apply 2 g topically 4 (four) times daily. 50 g Rudolpho Sevin, NP     PDMP not reviewed this encounter.   Rudolpho Sevin, NP 02/04/20 1448    Rudolpho Sevin, NP 02/04/20 1450

## 2020-02-04 NOTE — Discharge Instructions (Signed)
After speaking with you and exam I really feel like your symptoms are due to a muscle strain.  You have been given a shot of anti-inflammatory in the office today.  Do not take any other Motrin's, ibuprofens or Aleve until tomorrow.  Use muscle relaxer as needed for muscle tension.  This medication can make you sleepy so no driving after taking.  Use heating pad to affected area several times daily for 20 minutes at a time.  If pain should become worse or you should develop any shortness of breath, you will need to go immediately to the emergency department.  Please call  Dr. Delanna Ahmadi office to schedule follow-up from this visit

## 2020-03-21 DIAGNOSIS — E7849 Other hyperlipidemia: Secondary | ICD-10-CM | POA: Diagnosis not present

## 2020-03-21 DIAGNOSIS — I10 Essential (primary) hypertension: Secondary | ICD-10-CM | POA: Diagnosis not present

## 2020-06-07 DIAGNOSIS — E119 Type 2 diabetes mellitus without complications: Secondary | ICD-10-CM | POA: Diagnosis not present

## 2020-06-07 DIAGNOSIS — Z1389 Encounter for screening for other disorder: Secondary | ICD-10-CM | POA: Diagnosis not present

## 2020-06-07 DIAGNOSIS — R7309 Other abnormal glucose: Secondary | ICD-10-CM | POA: Diagnosis not present

## 2020-06-07 DIAGNOSIS — E7849 Other hyperlipidemia: Secondary | ICD-10-CM | POA: Diagnosis not present

## 2020-06-07 DIAGNOSIS — E782 Mixed hyperlipidemia: Secondary | ICD-10-CM | POA: Diagnosis not present

## 2020-06-07 DIAGNOSIS — Z0000 Encounter for general adult medical examination without abnormal findings: Secondary | ICD-10-CM | POA: Diagnosis not present

## 2020-06-18 DIAGNOSIS — E7849 Other hyperlipidemia: Secondary | ICD-10-CM | POA: Diagnosis not present

## 2020-06-18 DIAGNOSIS — I10 Essential (primary) hypertension: Secondary | ICD-10-CM | POA: Diagnosis not present

## 2020-06-21 ENCOUNTER — Other Ambulatory Visit (HOSPITAL_COMMUNITY): Payer: Self-pay | Admitting: Physician Assistant

## 2020-06-21 DIAGNOSIS — Z2839 Other underimmunization status: Secondary | ICD-10-CM

## 2020-06-27 ENCOUNTER — Ambulatory Visit (HOSPITAL_COMMUNITY)
Admission: RE | Admit: 2020-06-27 | Discharge: 2020-06-27 | Disposition: A | Discharge status: Home / Self Care | Payer: Medicare Other | Source: Ambulatory Visit | Attending: Physician Assistant | Admitting: Physician Assistant

## 2020-06-27 DIAGNOSIS — Z13820 Encounter for screening for osteoporosis: Secondary | ICD-10-CM | POA: Diagnosis not present

## 2020-06-27 DIAGNOSIS — Z780 Asymptomatic menopausal state: Secondary | ICD-10-CM | POA: Diagnosis not present

## 2020-06-27 DIAGNOSIS — Z2839 Other underimmunization status: Secondary | ICD-10-CM

## 2020-07-06 ENCOUNTER — Other Ambulatory Visit: Payer: Self-pay

## 2020-07-06 ENCOUNTER — Ambulatory Visit (INDEPENDENT_AMBULATORY_CARE_PROVIDER_SITE_OTHER): Payer: Medicare Other

## 2020-07-06 ENCOUNTER — Encounter: Payer: Self-pay | Admitting: Podiatry

## 2020-07-06 ENCOUNTER — Ambulatory Visit: Payer: Medicare Other | Admitting: Podiatry

## 2020-07-06 DIAGNOSIS — M778 Other enthesopathies, not elsewhere classified: Secondary | ICD-10-CM | POA: Diagnosis not present

## 2020-07-06 NOTE — Progress Notes (Signed)
Subjective:  Patient ID: Emily Andersen, female    DOB: 04-Apr-1955,  MRN: [id number]  Chief Complaint  Patient presents with  . Foot Pain     left foot sharp pain;routine foot care    65 y.o. female presents with the above complaint.  Patient presents with pain to the left dorsal foot.  Patient states is a sharp pain has progressive gotten worse.  She does not recall any kind of acute injury however she may have had her foot got uneven surface and can cause little bit of plantarflexion injury.  She denies any kind of acute direct trauma to the area.  There is been a lot of pain especially when walking.  She states the foot gets stiff.  Pain scale is 10 out of 10.  She denies any other acute complaints she has not seen anyone else prior to seeing me.   Review of Systems: Negative except as noted in the HPI. Denies N/V/F/Ch.  Past Medical History:  Diagnosis Date  . Anemia   . Arthritis   . GERD (gastroesophageal reflux disease)   . Hyperlipidemia   . Hypertension   . Obesity   . Uterine fibroid     Current Outpatient Medications:  .  amLODipine (NORVASC) 10 MG tablet, Take 1 tablet by mouth at bedtime., Disp: , Rfl:  .  aspirin 81 MG tablet, Take 81 mg by mouth daily., Disp: , Rfl:  .  Biotin 5 MG CAPS, Take 1 capsule by mouth daily., Disp: , Rfl:  .  Cholecalciferol (VITAMIN D3) 2000 UNITS TABS, Take 2,000 Units by mouth daily., Disp: , Rfl:  .  diclofenac Sodium (VOLTAREN) 1 % GEL, Apply 2 g topically 4 (four) times daily., Disp: 50 g, Rfl: 0 .  escitalopram (LEXAPRO) 10 MG tablet, Take 10 mg by mouth daily., Disp: , Rfl:  .  lisinopril-hydrochlorothiazide (ZESTORETIC) 20-25 MG tablet, Take 1 tablet by mouth daily., Disp: , Rfl:  .  methocarbamol (ROBAXIN) 500 MG tablet, Take 1 tablet (500 mg total) by mouth every 8 (eight) hours as needed for muscle spasms., Disp: 30 tablet, Rfl: 0 .  mirtazapine (REMERON) 15 MG tablet, Take 15 mg by mouth at bedtime., Disp: , Rfl:  .   Multiple Vitamins-Minerals (CENTRUM SILVER PO), Take by mouth daily., Disp: , Rfl:  .  omeprazole (PRILOSEC OTC) 20 MG tablet, Take 20 mg by mouth daily., Disp: , Rfl:  .  pravastatin (PRAVACHOL) 20 MG tablet, Take 20 mg by mouth daily., Disp: , Rfl:  .  pyridOXINE (VITAMIN B-6) 100 MG tablet, Take 100 mg by mouth daily., Disp: , Rfl:  .  terbinafine (LAMISIL) 250 MG tablet, Take 250 mg by mouth daily., Disp: , Rfl:  .  vitamin B-12 (CYANOCOBALAMIN) 500 MCG tablet, Take 500 mcg by mouth daily., Disp: , Rfl:   Social History   Tobacco Use  Smoking Status Never Smoker  Smokeless Tobacco Never Used    No Known Allergies Objective:  There were no vitals filed for this visit. There is no height or weight on file to calculate BMI. Constitutional Well developed. Well nourished.  Vascular Dorsalis pedis pulses palpable bilaterally. Posterior tibial pulses palpable bilaterally. Capillary refill normal to all digits.  No cyanosis or clubbing noted. Pedal hair growth normal.  Neurologic Normal speech. Oriented to person, place, and time. Epicritic sensation to light touch grossly present bilaterally.  Dermatologic Nails well groomed and normal in appearance. No open wounds. No skin lesions.  Orthopedic:  Pain  on palpation of the left dorsal foot.  Pain with resisted dorsiflexion of the digit..  Along the course of the entire extensor tendons into his each digital compartments.  No pain with flexor tendons.  No midfoot arthritis pain.  Lisfranc interval intact.   Radiographs: 3 views of skeletally mature adult left foot: No fractures noted.  No bony abnormalities identified plantar and posterior heel spurring noted mild midfoot arthritis noted.  Pes planus foot structure noted. Assessment:   1. Extensor tendinitis of foot    Plan:  Patient was evaluated and treated and all questions answered.  Left extensor tendinitis -Given the amount of pain that she is having this could likely be  extensor tendinitis versus arthritic flareup.  Since her pain is extending from the ankle all the way down to the's digital compartment I believe patient will benefit from cam boot immobilization to allow the soft tissue to heal appropriately.  This will also allow me to localize the pain to give more of a direct steroid injection to help decrease acute component associated with pain.  Patient agrees with the plan like to be immobilized with a cam boot -Cam boot was dispensed -If there is no improvement we will consider MRI during next clinical visit  No follow-ups on file.

## 2020-08-15 DIAGNOSIS — M25561 Pain in right knee: Secondary | ICD-10-CM | POA: Diagnosis not present

## 2020-08-18 DIAGNOSIS — I10 Essential (primary) hypertension: Secondary | ICD-10-CM | POA: Diagnosis not present

## 2020-08-18 DIAGNOSIS — E782 Mixed hyperlipidemia: Secondary | ICD-10-CM | POA: Diagnosis not present

## 2020-08-29 DIAGNOSIS — R7309 Other abnormal glucose: Secondary | ICD-10-CM | POA: Diagnosis not present

## 2020-08-29 DIAGNOSIS — I10 Essential (primary) hypertension: Secondary | ICD-10-CM | POA: Diagnosis not present

## 2020-08-29 DIAGNOSIS — K219 Gastro-esophageal reflux disease without esophagitis: Secondary | ICD-10-CM | POA: Diagnosis not present

## 2020-08-29 DIAGNOSIS — M159 Polyosteoarthritis, unspecified: Secondary | ICD-10-CM | POA: Diagnosis not present

## 2020-08-29 DIAGNOSIS — K5730 Diverticulosis of large intestine without perforation or abscess without bleeding: Secondary | ICD-10-CM | POA: Diagnosis not present

## 2020-08-29 DIAGNOSIS — Z01810 Encounter for preprocedural cardiovascular examination: Secondary | ICD-10-CM | POA: Diagnosis not present

## 2020-08-29 DIAGNOSIS — E782 Mixed hyperlipidemia: Secondary | ICD-10-CM | POA: Diagnosis not present

## 2020-09-05 DIAGNOSIS — Z131 Encounter for screening for diabetes mellitus: Secondary | ICD-10-CM | POA: Diagnosis not present

## 2020-09-05 DIAGNOSIS — E7800 Pure hypercholesterolemia, unspecified: Secondary | ICD-10-CM | POA: Diagnosis not present

## 2020-09-05 DIAGNOSIS — E559 Vitamin D deficiency, unspecified: Secondary | ICD-10-CM | POA: Diagnosis not present

## 2020-09-05 DIAGNOSIS — R5381 Other malaise: Secondary | ICD-10-CM | POA: Diagnosis not present

## 2020-10-04 DIAGNOSIS — Z9181 History of falling: Secondary | ICD-10-CM | POA: Diagnosis not present

## 2020-10-07 ENCOUNTER — Ambulatory Visit: Payer: Medicare Other | Admitting: Podiatry

## 2020-10-10 DIAGNOSIS — M25561 Pain in right knee: Secondary | ICD-10-CM | POA: Diagnosis not present

## 2020-10-13 DIAGNOSIS — Z01812 Encounter for preprocedural laboratory examination: Secondary | ICD-10-CM | POA: Diagnosis not present

## 2020-10-13 DIAGNOSIS — M25561 Pain in right knee: Secondary | ICD-10-CM | POA: Diagnosis not present

## 2020-10-13 DIAGNOSIS — M1711 Unilateral primary osteoarthritis, right knee: Secondary | ICD-10-CM | POA: Diagnosis not present

## 2020-10-13 DIAGNOSIS — R791 Abnormal coagulation profile: Secondary | ICD-10-CM | POA: Diagnosis not present

## 2020-10-19 DIAGNOSIS — M25561 Pain in right knee: Secondary | ICD-10-CM | POA: Diagnosis not present

## 2020-10-19 DIAGNOSIS — M1711 Unilateral primary osteoarthritis, right knee: Secondary | ICD-10-CM | POA: Diagnosis not present

## 2020-10-26 DIAGNOSIS — M1711 Unilateral primary osteoarthritis, right knee: Secondary | ICD-10-CM | POA: Diagnosis not present

## 2020-10-26 DIAGNOSIS — G8918 Other acute postprocedural pain: Secondary | ICD-10-CM | POA: Diagnosis not present

## 2020-10-27 DIAGNOSIS — I10 Essential (primary) hypertension: Secondary | ICD-10-CM | POA: Diagnosis not present

## 2020-10-27 DIAGNOSIS — Z7982 Long term (current) use of aspirin: Secondary | ICD-10-CM | POA: Diagnosis not present

## 2020-10-27 DIAGNOSIS — Z471 Aftercare following joint replacement surgery: Secondary | ICD-10-CM | POA: Diagnosis not present

## 2020-10-27 DIAGNOSIS — Z96651 Presence of right artificial knee joint: Secondary | ICD-10-CM | POA: Diagnosis not present

## 2020-10-27 DIAGNOSIS — Z9181 History of falling: Secondary | ICD-10-CM | POA: Diagnosis not present

## 2020-10-28 DIAGNOSIS — Z96651 Presence of right artificial knee joint: Secondary | ICD-10-CM | POA: Diagnosis not present

## 2020-10-28 DIAGNOSIS — M1711 Unilateral primary osteoarthritis, right knee: Secondary | ICD-10-CM | POA: Diagnosis not present

## 2020-10-30 DIAGNOSIS — Z7982 Long term (current) use of aspirin: Secondary | ICD-10-CM | POA: Diagnosis not present

## 2020-10-30 DIAGNOSIS — Z9181 History of falling: Secondary | ICD-10-CM | POA: Diagnosis not present

## 2020-10-30 DIAGNOSIS — Z471 Aftercare following joint replacement surgery: Secondary | ICD-10-CM | POA: Diagnosis not present

## 2020-10-30 DIAGNOSIS — Z96651 Presence of right artificial knee joint: Secondary | ICD-10-CM | POA: Diagnosis not present

## 2020-10-30 DIAGNOSIS — I10 Essential (primary) hypertension: Secondary | ICD-10-CM | POA: Diagnosis not present

## 2020-11-01 DIAGNOSIS — Z471 Aftercare following joint replacement surgery: Secondary | ICD-10-CM | POA: Diagnosis not present

## 2020-11-01 DIAGNOSIS — Z9181 History of falling: Secondary | ICD-10-CM | POA: Diagnosis not present

## 2020-11-01 DIAGNOSIS — Z96651 Presence of right artificial knee joint: Secondary | ICD-10-CM | POA: Diagnosis not present

## 2020-11-01 DIAGNOSIS — Z7982 Long term (current) use of aspirin: Secondary | ICD-10-CM | POA: Diagnosis not present

## 2020-11-01 DIAGNOSIS — I10 Essential (primary) hypertension: Secondary | ICD-10-CM | POA: Diagnosis not present

## 2020-11-03 DIAGNOSIS — Z9181 History of falling: Secondary | ICD-10-CM | POA: Diagnosis not present

## 2020-11-03 DIAGNOSIS — Z471 Aftercare following joint replacement surgery: Secondary | ICD-10-CM | POA: Diagnosis not present

## 2020-11-03 DIAGNOSIS — Z7982 Long term (current) use of aspirin: Secondary | ICD-10-CM | POA: Diagnosis not present

## 2020-11-03 DIAGNOSIS — Z96651 Presence of right artificial knee joint: Secondary | ICD-10-CM | POA: Diagnosis not present

## 2020-11-03 DIAGNOSIS — I10 Essential (primary) hypertension: Secondary | ICD-10-CM | POA: Diagnosis not present

## 2020-11-05 DIAGNOSIS — I10 Essential (primary) hypertension: Secondary | ICD-10-CM | POA: Diagnosis not present

## 2020-11-05 DIAGNOSIS — Z471 Aftercare following joint replacement surgery: Secondary | ICD-10-CM | POA: Diagnosis not present

## 2020-11-05 DIAGNOSIS — Z96651 Presence of right artificial knee joint: Secondary | ICD-10-CM | POA: Diagnosis not present

## 2020-11-05 DIAGNOSIS — Z7982 Long term (current) use of aspirin: Secondary | ICD-10-CM | POA: Diagnosis not present

## 2020-11-05 DIAGNOSIS — Z9181 History of falling: Secondary | ICD-10-CM | POA: Diagnosis not present

## 2020-11-07 DIAGNOSIS — Z7982 Long term (current) use of aspirin: Secondary | ICD-10-CM | POA: Diagnosis not present

## 2020-11-07 DIAGNOSIS — Z96651 Presence of right artificial knee joint: Secondary | ICD-10-CM | POA: Diagnosis not present

## 2020-11-07 DIAGNOSIS — Z9181 History of falling: Secondary | ICD-10-CM | POA: Diagnosis not present

## 2020-11-07 DIAGNOSIS — M1711 Unilateral primary osteoarthritis, right knee: Secondary | ICD-10-CM | POA: Diagnosis not present

## 2020-11-07 DIAGNOSIS — Z471 Aftercare following joint replacement surgery: Secondary | ICD-10-CM | POA: Diagnosis not present

## 2020-11-07 DIAGNOSIS — I10 Essential (primary) hypertension: Secondary | ICD-10-CM | POA: Diagnosis not present

## 2020-11-08 DIAGNOSIS — M6281 Muscle weakness (generalized): Secondary | ICD-10-CM | POA: Diagnosis not present

## 2020-11-08 DIAGNOSIS — M25561 Pain in right knee: Secondary | ICD-10-CM | POA: Diagnosis not present

## 2020-11-08 DIAGNOSIS — M1711 Unilateral primary osteoarthritis, right knee: Secondary | ICD-10-CM | POA: Diagnosis not present

## 2020-11-08 DIAGNOSIS — M25661 Stiffness of right knee, not elsewhere classified: Secondary | ICD-10-CM | POA: Diagnosis not present

## 2020-11-15 DIAGNOSIS — M6281 Muscle weakness (generalized): Secondary | ICD-10-CM | POA: Diagnosis not present

## 2020-11-15 DIAGNOSIS — M25561 Pain in right knee: Secondary | ICD-10-CM | POA: Diagnosis not present

## 2020-11-15 DIAGNOSIS — M25661 Stiffness of right knee, not elsewhere classified: Secondary | ICD-10-CM | POA: Diagnosis not present

## 2020-11-15 DIAGNOSIS — M1711 Unilateral primary osteoarthritis, right knee: Secondary | ICD-10-CM | POA: Diagnosis not present

## 2020-11-18 DIAGNOSIS — M6281 Muscle weakness (generalized): Secondary | ICD-10-CM | POA: Diagnosis not present

## 2020-11-18 DIAGNOSIS — M25561 Pain in right knee: Secondary | ICD-10-CM | POA: Diagnosis not present

## 2020-11-18 DIAGNOSIS — M25661 Stiffness of right knee, not elsewhere classified: Secondary | ICD-10-CM | POA: Diagnosis not present

## 2020-11-18 DIAGNOSIS — M1711 Unilateral primary osteoarthritis, right knee: Secondary | ICD-10-CM | POA: Diagnosis not present

## 2020-11-18 IMAGING — DX DG FOOT COMPLETE 3+V*L*
3 series · 3 of 3 positions shown · non-contrast
Comparison: None.

CLINICAL DATA: Left foot pain

EXAM:
LEFT FOOT - COMPLETE 3+ VIEW

[foot ap]
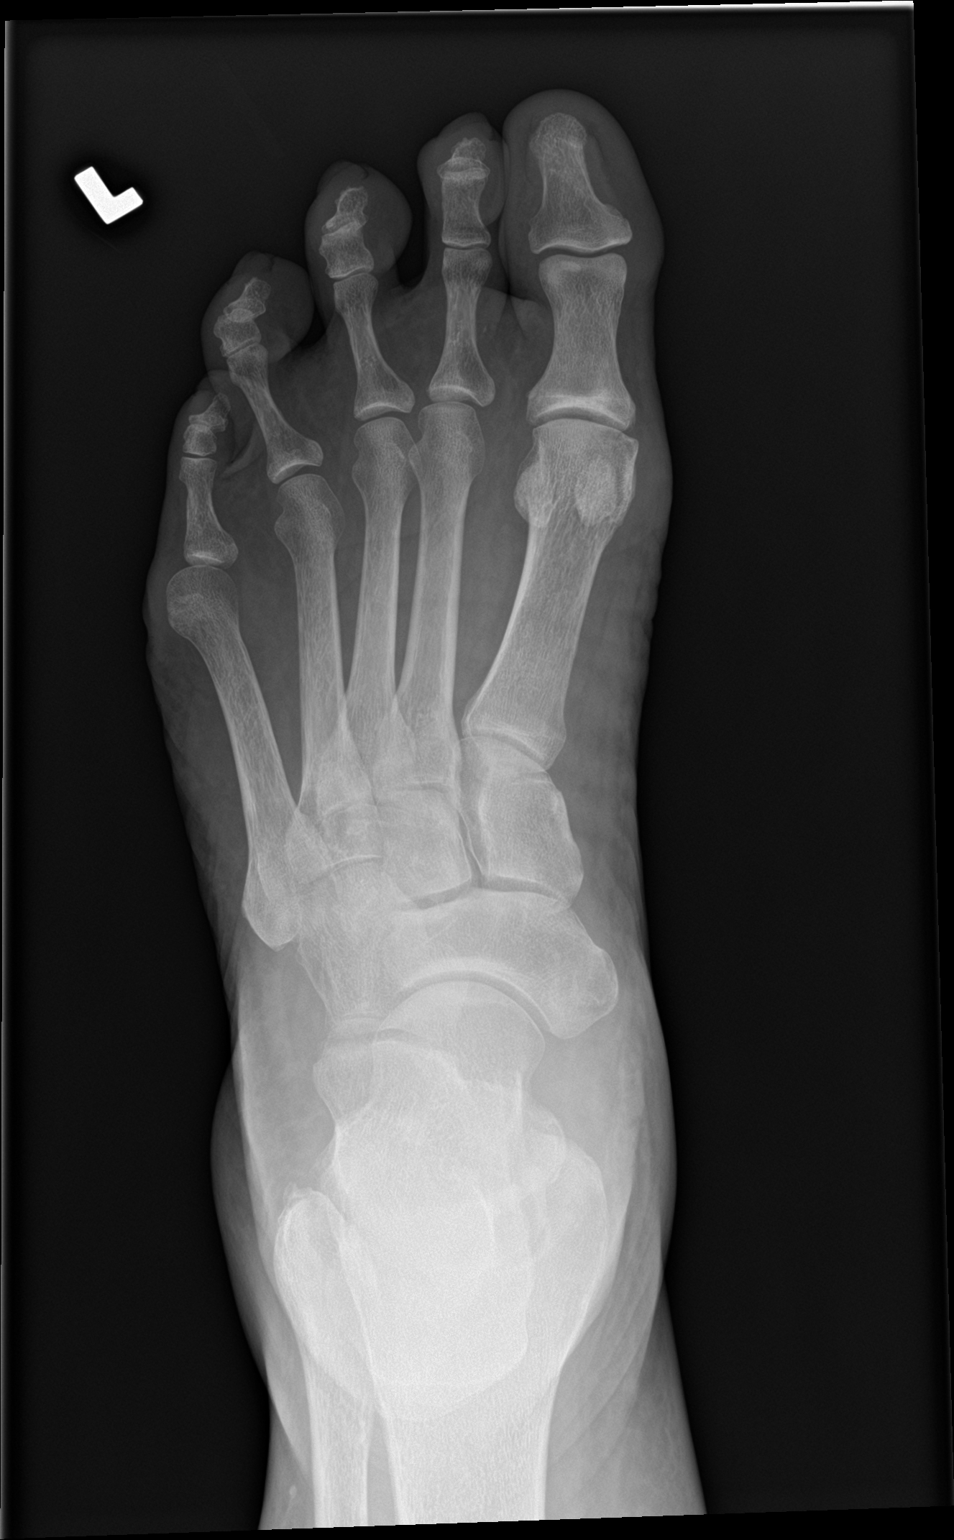

[foot obl]
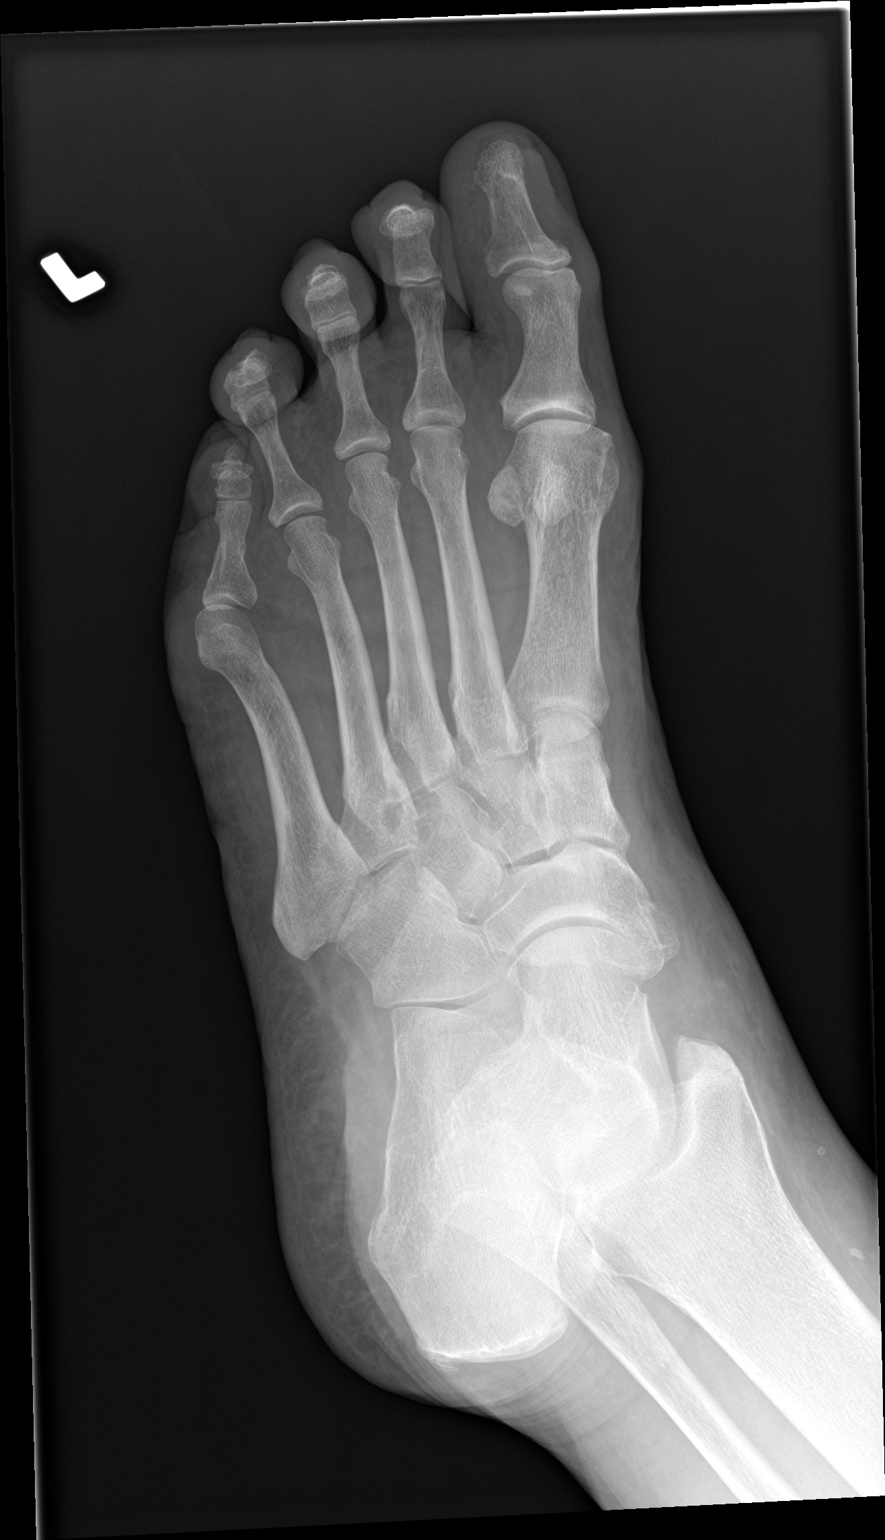

[foot lat]
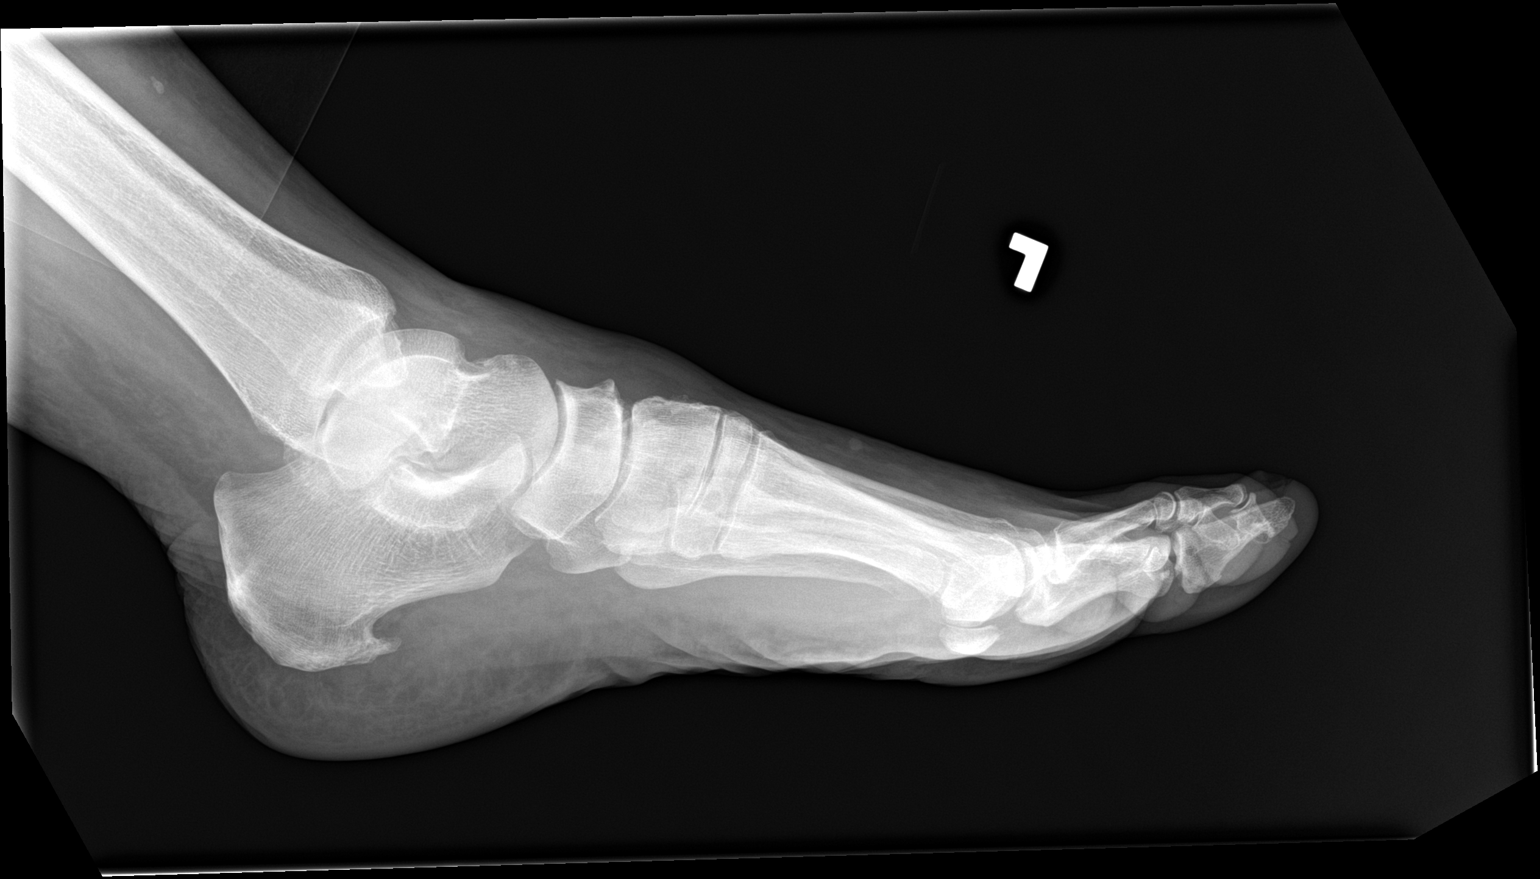

[3 of 3 positions shown; findings below may reference images not displayed]

FINDINGS: No fracture or dislocation is seen.

Mild degenerative changes of the dorsal midfoot.

Small plantar calcaneal enthesophyte.

The visualized soft tissues are unremarkable.
IMPRESSION: Negative.

## 2020-11-22 DIAGNOSIS — M25661 Stiffness of right knee, not elsewhere classified: Secondary | ICD-10-CM | POA: Diagnosis not present

## 2020-11-22 DIAGNOSIS — M6281 Muscle weakness (generalized): Secondary | ICD-10-CM | POA: Diagnosis not present

## 2020-11-22 DIAGNOSIS — M25561 Pain in right knee: Secondary | ICD-10-CM | POA: Diagnosis not present

## 2020-11-22 DIAGNOSIS — M1711 Unilateral primary osteoarthritis, right knee: Secondary | ICD-10-CM | POA: Diagnosis not present

## 2020-11-25 DIAGNOSIS — M25561 Pain in right knee: Secondary | ICD-10-CM | POA: Diagnosis not present

## 2020-11-25 DIAGNOSIS — M6281 Muscle weakness (generalized): Secondary | ICD-10-CM | POA: Diagnosis not present

## 2020-11-25 DIAGNOSIS — M25661 Stiffness of right knee, not elsewhere classified: Secondary | ICD-10-CM | POA: Diagnosis not present

## 2020-11-25 DIAGNOSIS — M1711 Unilateral primary osteoarthritis, right knee: Secondary | ICD-10-CM | POA: Diagnosis not present

## 2020-11-29 DIAGNOSIS — M25561 Pain in right knee: Secondary | ICD-10-CM | POA: Diagnosis not present

## 2020-11-29 DIAGNOSIS — M6281 Muscle weakness (generalized): Secondary | ICD-10-CM | POA: Diagnosis not present

## 2020-11-29 DIAGNOSIS — M25661 Stiffness of right knee, not elsewhere classified: Secondary | ICD-10-CM | POA: Diagnosis not present

## 2020-11-29 DIAGNOSIS — Z23 Encounter for immunization: Secondary | ICD-10-CM | POA: Diagnosis not present

## 2020-11-29 DIAGNOSIS — M1711 Unilateral primary osteoarthritis, right knee: Secondary | ICD-10-CM | POA: Diagnosis not present

## 2020-11-30 DIAGNOSIS — H26493 Other secondary cataract, bilateral: Secondary | ICD-10-CM | POA: Diagnosis not present

## 2020-12-02 DIAGNOSIS — M25661 Stiffness of right knee, not elsewhere classified: Secondary | ICD-10-CM | POA: Diagnosis not present

## 2020-12-02 DIAGNOSIS — M6281 Muscle weakness (generalized): Secondary | ICD-10-CM | POA: Diagnosis not present

## 2020-12-02 DIAGNOSIS — M25571 Pain in right ankle and joints of right foot: Secondary | ICD-10-CM | POA: Diagnosis not present

## 2020-12-02 DIAGNOSIS — M1711 Unilateral primary osteoarthritis, right knee: Secondary | ICD-10-CM | POA: Diagnosis not present

## 2020-12-06 DIAGNOSIS — M25661 Stiffness of right knee, not elsewhere classified: Secondary | ICD-10-CM | POA: Diagnosis not present

## 2020-12-06 DIAGNOSIS — M6281 Muscle weakness (generalized): Secondary | ICD-10-CM | POA: Diagnosis not present

## 2020-12-06 DIAGNOSIS — M1711 Unilateral primary osteoarthritis, right knee: Secondary | ICD-10-CM | POA: Diagnosis not present

## 2020-12-08 DIAGNOSIS — M1711 Unilateral primary osteoarthritis, right knee: Secondary | ICD-10-CM | POA: Diagnosis not present

## 2020-12-09 DIAGNOSIS — M25661 Stiffness of right knee, not elsewhere classified: Secondary | ICD-10-CM | POA: Diagnosis not present

## 2020-12-09 DIAGNOSIS — M25561 Pain in right knee: Secondary | ICD-10-CM | POA: Diagnosis not present

## 2020-12-09 DIAGNOSIS — M6281 Muscle weakness (generalized): Secondary | ICD-10-CM | POA: Diagnosis not present

## 2020-12-09 DIAGNOSIS — M1711 Unilateral primary osteoarthritis, right knee: Secondary | ICD-10-CM | POA: Diagnosis not present

## 2020-12-19 DIAGNOSIS — I10 Essential (primary) hypertension: Secondary | ICD-10-CM | POA: Diagnosis not present

## 2020-12-19 DIAGNOSIS — E782 Mixed hyperlipidemia: Secondary | ICD-10-CM | POA: Diagnosis not present

## 2020-12-23 DIAGNOSIS — I10 Essential (primary) hypertension: Secondary | ICD-10-CM | POA: Diagnosis not present

## 2020-12-23 DIAGNOSIS — D170 Benign lipomatous neoplasm of skin and subcutaneous tissue of head, face and neck: Secondary | ICD-10-CM | POA: Diagnosis not present

## 2020-12-23 DIAGNOSIS — M159 Polyosteoarthritis, unspecified: Secondary | ICD-10-CM | POA: Diagnosis not present

## 2020-12-30 DIAGNOSIS — D3131 Benign neoplasm of right choroid: Secondary | ICD-10-CM | POA: Diagnosis not present

## 2020-12-30 DIAGNOSIS — H33311 Horseshoe tear of retina without detachment, right eye: Secondary | ICD-10-CM | POA: Diagnosis not present

## 2021-01-06 ENCOUNTER — Encounter: Payer: Self-pay | Admitting: Family Medicine

## 2021-01-20 DIAGNOSIS — Z1231 Encounter for screening mammogram for malignant neoplasm of breast: Secondary | ICD-10-CM | POA: Diagnosis not present

## 2021-01-23 ENCOUNTER — Other Ambulatory Visit: Payer: Self-pay | Admitting: *Deleted

## 2021-01-23 DIAGNOSIS — D170 Benign lipomatous neoplasm of skin and subcutaneous tissue of head, face and neck: Secondary | ICD-10-CM

## 2021-01-26 ENCOUNTER — Other Ambulatory Visit: Payer: Self-pay

## 2021-01-26 ENCOUNTER — Encounter: Payer: Self-pay | Admitting: General Surgery

## 2021-01-26 ENCOUNTER — Ambulatory Visit: Payer: Medicare Other | Admitting: General Surgery

## 2021-01-26 VITALS — BP 126/85 | HR 101 | Temp 98.7°F | Resp 14 | Ht 66.0 in | Wt 251.0 lb

## 2021-01-26 DIAGNOSIS — D170 Benign lipomatous neoplasm of skin and subcutaneous tissue of head, face and neck: Secondary | ICD-10-CM

## 2021-01-26 NOTE — Patient Instructions (Signed)
Lipoma  A lipoma is a noncancerous (benign) tumor that is made up of fat cells. This is a very common type of soft-tissue growth. Lipomas are usually found under the skin (subcutaneous). They may occur in any tissue of the body that contains fat. Common areas forlipomas to appear include the back, arms, shoulders, buttocks, and thighs. Lipomas grow slowly, and they are usually painless. Most lipomas do not causeproblems and do not require treatment. What are the causes? The cause of this condition is not known. What increases the risk? You are more likely to develop this condition if: You are 40-60 years old. You have a family history of lipomas. What are the signs or symptoms? A lipoma usually appears as a small, round bump under the skin. In most cases, the lump will: Feel soft or rubbery. Not cause pain or other symptoms. However, if a lipoma is located in an area where it pushes on nerves, it canbecome painful or cause other symptoms. How is this diagnosed? A lipoma can usually be diagnosed with a physical exam. You may also have tests to confirm the diagnosis and to rule out other conditions. Tests may include: Imaging tests, such as a CT scan or an MRI. Removal of a tissue sample to be looked at under a microscope (biopsy). How is this treated? Treatment for this condition depends on the size of the lipoma and whether it is causing any symptoms. For small lipomas that are not causing problems, no treatment is needed. If a lipoma is bigger or it causes problems, surgery may be done to remove the lipoma. Lipomas can also be removed to improve appearance. Most often, the procedure is done after applying a medicine that numbs the area (local anesthetic). Liposuction may be done to reduce the size of the lipoma before it is removed through surgery, or it may be done to remove the lipoma. Lipomas are removed with this method in order to limit incision size and scarring. A liposuction tube is  inserted through a small incision into the lipoma, and the contents of the lipoma are removed through the tube with suction. Follow these instructions at home: Watch your lipoma for any changes. Keep all follow-up visits as told by your health care provider. This is important. Contact a health care provider if: Your lipoma becomes larger or hard. Your lipoma becomes painful, red, or increasingly swollen. These could be signs of infection or a more serious condition. Get help right away if: You develop tingling or numbness in an area near the lipoma. This could indicate that the lipoma is causing nerve damage. Summary A lipoma is a noncancerous tumor that is made up of fat cells. Most lipomas do not cause problems and do not require treatment. If a lipoma is bigger or it causes problems, surgery may be done to remove the lipoma. Contact a health care provider if your lipoma becomes larger or hard, or if it becomes painful, red, or increasingly swollen. Pain, redness, and swelling could be signs of infection or a more serious condition. This information is not intended to replace advice given to you by your health care provider. Make sure you discuss any questions you have with your healthcare provider. Document Revised: 09/22/2018 Document Reviewed: 09/22/2018 Elsevier Patient Education  2022 [employer]  

## 2021-01-26 NOTE — Progress Notes (Signed)
[facility] History and Physical  Reason for Referral:Lipoma on neck Referring Physician: Sharilyn Sites, MD   Chief Complaint   New Patient (Initial Visit)     Emily Andersen is a 65 y.o. female.  HPI: Emily Andersen is a 65 yo who has noticed a bulge on the right posterior neck area that has been bothering her and causing some discomfort with moving her neck. She denies any drainage or swelling of the area. It could have gotten bigger over the time but she is not sure. She just had a knee replacement and is doing well after this surgery. She was referred for lipoma excision.   Past Medical History:  Diagnosis Date   Anemia    Arthritis    GERD (gastroesophageal reflux disease)    Hyperlipidemia    Hypertension    Obesity    Uterine fibroid     Past Surgical History:  Procedure Laterality Date   ABDOMINAL HYSTERECTOMY  1974   COLONOSCOPY     TOTAL KNEE ARTHROPLASTY Left 6812   UMBILICAL HERNIA REPAIR  2000    Family History  Problem Relation Age of Onset   Heart disease Father    Alzheimer's disease Mother    Colon polyps Sister    Breast cancer Sister    Emphysema Sister        smoker   Emphysema Brother        smoker   Cancer Maternal Grandmother        type unknown    Social History   Tobacco Use   Smoking status: Never   Smokeless tobacco: Never  Vaping Use   Vaping Use: Never used  Substance Use Topics   Alcohol use: No   Drug use: No    Medications: I have reviewed the patient's current medications. Allergies as of 01/26/2021   No Known Allergies      Medication List        Accurate as of January 26, 2021  1:58 PM. If you have any questions, ask your nurse or doctor.          amLODipine 10 MG tablet Commonly known as: NORVASC Take 1 tablet by mouth at bedtime.   aspirin 81 MG tablet Take 81 mg by mouth daily.   Biotin 5 MG Caps Take 1 capsule by mouth daily.   CENTRUM SILVER PO Take by mouth daily.    diclofenac Sodium 1 % Gel Commonly known as: Voltaren Apply 2 g topically 4 (four) times daily.   escitalopram 10 MG tablet Commonly known as: LEXAPRO Take 10 mg by mouth daily.   lisinopril-hydrochlorothiazide 20-25 MG tablet Commonly known as: ZESTORETIC Take 1 tablet by mouth daily.   methocarbamol 500 MG tablet Commonly known as: Robaxin Take 1 tablet (500 mg total) by mouth every 8 (eight) hours as needed for muscle spasms.   mirtazapine 15 MG tablet Commonly known as: REMERON Take 15 mg by mouth at bedtime.   omeprazole 20 MG tablet Commonly known as: PRILOSEC OTC Take 20 mg by mouth daily.   pravastatin 20 MG tablet Commonly known as: PRAVACHOL Take 20 mg by mouth daily.   pyridOXINE 100 MG tablet Commonly known as: VITAMIN B-6 Take 100 mg by mouth daily.   terbinafine 250 MG tablet Commonly known as: LAMISIL Take 250 mg by mouth daily.   vitamin B-12 500 MCG tablet Commonly known as: CYANOCOBALAMIN Take 500 mcg by mouth daily.   Vitamin D3 50 MCG (2000 UT) Tabs  Take 2,000 Units by mouth daily.         ROS:  A comprehensive review of systems was negative except for: Musculoskeletal: positive for mass on right posterior neck, recent knee replacement  Blood pressure 126/85, pulse (!) 101, temperature 98.7 F (37.1 C), temperature source Other (Comment), resp. rate 14, height 5\' 6"  (1.676 m), weight 251 lb (113.9 kg), SpO2 99 %. Physical Exam Constitutional:      Appearance: Normal appearance.  HENT:     Head: Normocephalic.     Mouth/Throat:     Mouth: Mucous membranes are moist.  Eyes:     Extraocular Movements: Extraocular movements intact.  Neck:     Comments: Right posterior neck 2cm bulge, superficial mobile Cardiovascular:     Rate and Rhythm: Normal rate and regular rhythm.  Pulmonary:     Effort: Pulmonary effort is normal.     Breath sounds: Normal breath sounds.  Abdominal:     General: There is no distension.     Palpations:  Abdomen is soft.     Tenderness: There is no abdominal tenderness.  Musculoskeletal:        General: Normal range of motion.  Skin:    General: Skin is warm.  Neurological:     General: No focal deficit present.     Mental Status: She is oriented to person, place, and time.  Psychiatric:        Mood and Affect: Mood normal.        Behavior: Behavior normal.    Results: None   Assessment & Plan:  Emily Andersen is a 65 y.o. female with what is likely a lipoma on her neck. Discussed excision in the office with local. Discussed risk of bleeding, infection, recurrence, and needing sutures that will be removed. Discussed finding something other than a lipoma and this not helping her discomfort.    Future Appointments  Date Time Provider [place]  03/09/2021  3:00 PM Virl Cagey, MD [facility] None    All questions were answered to the satisfaction of the patient.    Virl Cagey 01/26/2021, 1:58 PM

## 2021-02-03 DIAGNOSIS — H33311 Horseshoe tear of retina without detachment, right eye: Secondary | ICD-10-CM | POA: Diagnosis not present

## 2021-02-03 DIAGNOSIS — D3131 Benign neoplasm of right choroid: Secondary | ICD-10-CM | POA: Diagnosis not present

## 2021-02-23 DIAGNOSIS — E7800 Pure hypercholesterolemia, unspecified: Secondary | ICD-10-CM | POA: Diagnosis not present

## 2021-02-23 DIAGNOSIS — E559 Vitamin D deficiency, unspecified: Secondary | ICD-10-CM | POA: Diagnosis not present

## 2021-02-23 DIAGNOSIS — R7303 Prediabetes: Secondary | ICD-10-CM | POA: Diagnosis not present

## 2021-02-23 DIAGNOSIS — Z9181 History of falling: Secondary | ICD-10-CM | POA: Diagnosis not present

## 2021-02-23 DIAGNOSIS — Z79899 Other long term (current) drug therapy: Secondary | ICD-10-CM | POA: Diagnosis not present

## 2021-03-09 ENCOUNTER — Encounter: Payer: Self-pay | Admitting: General Surgery

## 2021-03-09 ENCOUNTER — Ambulatory Visit: Payer: Medicare Other | Admitting: General Surgery

## 2021-03-09 ENCOUNTER — Other Ambulatory Visit: Payer: Self-pay

## 2021-03-09 VITALS — BP 128/80 | HR 100 | Temp 97.9°F | Resp 14 | Ht 66.0 in | Wt 249.0 lb

## 2021-03-09 DIAGNOSIS — D170 Benign lipomatous neoplasm of skin and subcutaneous tissue of head, face and neck: Secondary | ICD-10-CM | POA: Diagnosis not present

## 2021-03-09 MED ORDER — OXYCODONE HCL 5 MG PO TABS: 5.0000 mg | ORAL_TABLET | ORAL | 0 refills | Status: DC | PRN

## 2021-03-09 NOTE — Patient Instructions (Signed)
Remove your bandage in 48 hours. If it is saturated you can replace it before then. Neosporin and bandaid to area daily after that [facility] to shower.  Call with issues.

## 2021-03-10 NOTE — Progress Notes (Signed)
[facility] Operative Note  03/10/21  Preoperative Diagnosis: Right posterior neck lipoma    Postoperative Diagnosis: Same   Procedure(s) Performed: Excision of right posterior neck lipoma, 2cm    Surgeon: Lanell Matar. Constance Haw, MD   Assistants: No qualified resident was available    Anesthesia: Lidocaine 1%    Specimens: Lipoma    Estimated Blood Loss: Minimal   Blood Replacement: None    Complications: None   Wound Class: Clean    Operative Indications: Emily Andersen is a 66 yo with a lump on her right posterior neck, we discussed excision and risk of bleeding, infection, recurrence, finding something other than lipoma.   Findings: Right neck lipoma 2cm    Procedure: The patient was taken to the procedure room and placed on her side with her right posterior neck exposed. The neck was prepped and draped and lidocaine was injected. An incision was made and carried down into the subcutaneous tissue. With sharp dissection the lipoma was removed. The cavity was made hemostatic. 3-0 Vicryl interrupts were placed in the cavity. The incision was closed with 3-0 Nylon interrupt sutures.   Gauze and tegaderm were placed.    Emily Labrum, MD [facility] [address] [place], [place] [address] [phone] (office)

## 2021-03-14 LAB — PATHOLOGY REPORT

## 2021-03-14 LAB — TISSUE SPECIMEN

## 2021-03-21 DIAGNOSIS — I10 Essential (primary) hypertension: Secondary | ICD-10-CM | POA: Diagnosis not present

## 2021-03-21 DIAGNOSIS — E782 Mixed hyperlipidemia: Secondary | ICD-10-CM | POA: Diagnosis not present

## 2021-03-22 ENCOUNTER — Other Ambulatory Visit: Payer: Self-pay

## 2021-03-22 ENCOUNTER — Ambulatory Visit (INDEPENDENT_AMBULATORY_CARE_PROVIDER_SITE_OTHER): Payer: Medicare Other | Admitting: General Surgery

## 2021-03-22 ENCOUNTER — Encounter: Payer: Self-pay | Admitting: General Surgery

## 2021-03-22 VITALS — BP 135/85 | HR 110 | Temp 98.2°F | Resp 16 | Ht 66.0 in | Wt 246.0 lb

## 2021-03-22 DIAGNOSIS — D170 Benign lipomatous neoplasm of skin and subcutaneous tissue of head, face and neck: Secondary | ICD-10-CM

## 2021-03-24 DIAGNOSIS — I10 Essential (primary) hypertension: Secondary | ICD-10-CM | POA: Diagnosis not present

## 2021-03-24 DIAGNOSIS — I4891 Unspecified atrial fibrillation: Secondary | ICD-10-CM | POA: Diagnosis not present

## 2021-03-25 NOTE — Progress Notes (Signed)
[facility]  Doing well, lipoma on pathology. Right neck incision healing.   BP 135/85    Pulse (!) 110    Temp 98.2 F (36.8 C) (Other (Comment))    Resp 16    Ht 5\' 6"  (1.676 m)    Wt 246 lb (111.6 kg)    SpO2 98%    BMI 39.71 kg/m  Sutures removed, steri strips placed,No erythema or drainage.   Patient s/p excision of right neck lipoma. Doing well. PRN follow up Steristrips will peel off in the next 5-7 days. You can remove them once they are peeling off. It is ok to shower. Pat the area dry.   Curlene Labrum, MD [facility] [address] [place], [place] [address] [phone] (office)

## 2021-03-30 DIAGNOSIS — I4891 Unspecified atrial fibrillation: Secondary | ICD-10-CM | POA: Diagnosis not present

## 2021-04-14 NOTE — Progress Notes (Signed)
CARDIOLOGY CONSULT NOTE       Patient ID: Emily Andersen MRN: [id number] DOB/AGE: 1955/06/20 66 y.o.  Admit date: (Not on file) Referring Physician: Gerarda Fraction Primary Physician: Sharilyn Sites, MD Primary Cardiologist: New Reason for Consultation: PAF  Active Problems:   * No active hospital problems. *   HPI:  66 y.o. referred by Dr Gerarda Fraction for PAF History of DM, Anxiety, HTN and palpitations Seen in office by primary 03/24/21 thought to be in new onset afib. Started on coumadin and atenolol Poor quality tracing from their office shows fib/flutter rate 86 bpm nonspecific ST changes Recent right neck surgery Dr Constance Haw for lipoma healing well   She continues to have palpitations despite lopressor 50 bid. She says they checked her INR last Thursday No bleeding issues and normal kidney function per patient Discussed starting eliquis as safer/better blood thinner Discussed Passapatanzy likely in 3 weeks since we don't know when INR RX will try to get labs from last Thursday at [facility] office and check labs in our office today   ROS All other systems reviewed and negative except as noted above  Past Medical History:  Diagnosis Date   Anemia    Anxiety    Arthritis    Diabetes ([place])    GERD (gastroesophageal reflux disease)    Hyperlipidemia    Hypertension    Obesity    Palpitations    Uterine fibroid     Family History  Problem Relation Age of Onset   Heart disease Father    Alzheimer's disease Mother    Colon polyps Sister    Breast cancer Sister    Emphysema Sister        smoker   Emphysema Brother        smoker   Cancer Maternal Grandmother        type unknown    Social History   Socioeconomic History   Marital status: Married    Spouse name: Not on file   Number of children: 1   Years of education: Not on file   Highest education level: Not on file  Occupational History   Occupation: retired Quarry manager  Tobacco Use   Smoking status: Never   Smokeless tobacco: Never   Vaping Use   Vaping Use: Never used  Substance and Sexual Activity   Alcohol use: No   Drug use: No   Sexual activity: Not on file  Other Topics Concern   Not on file  Social History Narrative   Not on file   Social Determinants of Health   Financial Resource Strain: Not on file  Food Insecurity: Not on file  Transportation Needs: Not on file  Physical Activity: Not on file  Stress: Not on file  Social Connections: Not on file  Intimate Partner Violence: Not on file    Past Surgical History:  Procedure Laterality Date   ABDOMINAL HYSTERECTOMY  1974   COLONOSCOPY     TOTAL KNEE ARTHROPLASTY Left 5400   UMBILICAL HERNIA REPAIR  2000      Current Outpatient Medications:    ALPRAZolam (XANAX) 0.5 MG tablet, Take 0.5 mg by mouth at bedtime as needed for anxiety., Disp: , Rfl:    apixaban (ELIQUIS) 5 MG TABS tablet, Take 1 tablet (5 mg total) by mouth 2 (two) times daily., Disp: 60 tablet, Rfl: 11   Cholecalciferol (VITAMIN D3) 2000 UNITS TABS, Take 2,000 Units by mouth daily., Disp: , Rfl:    diltiazem (CARDIZEM CD) 240 MG 24 hr  capsule, Take 1 capsule (240 mg total) by mouth daily., Disp: 90 capsule, Rfl: 3   escitalopram (LEXAPRO) 10 MG tablet, Take 10 mg by mouth daily., Disp: , Rfl:    esomeprazole (NEXIUM) 20 MG capsule, Take 20 mg by mouth daily at 12 noon., Disp: , Rfl:    lisinopril-hydrochlorothiazide (ZESTORETIC) 20-25 MG tablet, Take 1 tablet by mouth daily., Disp: , Rfl:    metoprolol tartrate (LOPRESSOR) 50 MG tablet, Take 1 tablet (50 mg total) by mouth 2 (two) times daily., Disp: 180 tablet, Rfl: 3   mirtazapine (REMERON) 15 MG tablet, Take 15 mg by mouth at bedtime., Disp: , Rfl:    Multiple Vitamins-Minerals (CENTRUM SILVER PO), Take by mouth daily., Disp: , Rfl:    omeprazole (PRILOSEC OTC) 20 MG tablet, Take 20 mg by mouth daily., Disp: , Rfl:    pravastatin (PRAVACHOL) 20 MG tablet, Take 20 mg by mouth daily., Disp: , Rfl:    pyridOXINE (VITAMIN B-6) 100 MG  tablet, Take 100 mg by mouth daily., Disp: , Rfl:    terbinafine (LAMISIL) 250 MG tablet, Take 250 mg by mouth daily., Disp: , Rfl:    vitamin B-12 (CYANOCOBALAMIN) 500 MCG tablet, Take 500 mcg by mouth daily., Disp: , Rfl:     Physical Exam: Blood pressure 118/80, pulse (!) 105, height 5\' 7"  (1.702 m), weight 255 lb (115.7 kg), SpO2 98 %.    Affect appropriate Overweight black female HEENT: normal Neck supple with no adenopathy JVP normal no bruits no thyromegaly Lungs clear with no wheezing and good diaphragmatic motion Heart:  S1/S2 no murmur, no rub, gallop or click PMI normal Abdomen: benighn, BS positve, no tenderness, no AAA no bruit.  No HSM or HJR Distal pulses intact with no bruits No edema Neuro non-focal Skin warm and dry No muscular weakness   Labs:   Lab Results  Component Value Date   WBC 6.3 04/05/2009   HGB 13.0 04/05/2009   HCT 39.7 04/05/2009   MCV 82.9 04/05/2009   PLT 233 04/05/2009   No results for input(s): NA, K, CL, CO2, BUN, CREATININE, CALCIUM, PROT, BILITOT, ALKPHOS, ALT, AST, GLUCOSE in the last 168 hours.  Invalid input(s): LABALBU No results found for: CKTOTAL, CKMB, CKMBINDEX, TROPONINI No results found for: CHOL No results found for: HDL No results found for: LDLCALC No results found for: TRIG No results found for: CHOLHDL No results found for: LDLDIRECT    Radiology: No results found.  EKG: see HPI  AFib rate 105 nonspecific ST chagnes    ASSESSMENT AND PLAN:   New onset afib:  03/24/21 started on atenolol and coumadin by primary Check echo for LA size and EF Discussed DOAC as better blood thinner Check CBC, PLT, BMET and start eliquis 5 bid Check INR today Given age feel that at least one attempt at [facility] in order Once on 4 weeks of uninterrupted DOAC will arrange pending TTE results CHADVASC 3 HTN:  Well controlled.  Continue current medications and low sodium Dash type diet.   HLD:  continue statin  Anxiety/Depression:  continue  Lexapro   Echo D/c coumadin and ASA Start eliquis 5 bid Labs today  D/c Norvasc start Cardizem 240 mg daily  Schedule DCC in 3 weeks   F/U post [facility]   Time spent reviewing notes from [facility]/labs patient interview exam composing note arranging INR check today labs and arranging [facility] with orders 60 minutes   Signed: Jenkins Rouge 04/18/2021, 3:05 PM

## 2021-04-18 ENCOUNTER — Ambulatory Visit: Payer: Medicare Other | Admitting: Cardiovascular Disease

## 2021-04-18 ENCOUNTER — Encounter: Payer: Self-pay | Admitting: Cardiovascular Disease

## 2021-04-18 ENCOUNTER — Other Ambulatory Visit: Payer: Self-pay

## 2021-04-18 VITALS — BP 118/80 | HR 105 | Ht 67.0 in | Wt 255.0 lb

## 2021-04-18 DIAGNOSIS — I480 Paroxysmal atrial fibrillation: Secondary | ICD-10-CM | POA: Diagnosis not present

## 2021-04-18 DIAGNOSIS — Z5189 Encounter for other specified aftercare: Secondary | ICD-10-CM | POA: Diagnosis not present

## 2021-04-18 DIAGNOSIS — I10 Essential (primary) hypertension: Secondary | ICD-10-CM | POA: Diagnosis not present

## 2021-04-18 DIAGNOSIS — E782 Mixed hyperlipidemia: Secondary | ICD-10-CM | POA: Diagnosis not present

## 2021-04-18 MED ORDER — APIXABAN 5 MG PO TABS: 5.0000 mg | ORAL_TABLET | Freq: Two times a day (BID) | ORAL | 11 refills | Status: DC

## 2021-04-18 MED ORDER — DILTIAZEM HCL ER COATED BEADS 240 MG PO CP24: 240.0000 mg | ORAL_CAPSULE | Freq: Every day | ORAL | 3 refills | Status: DC

## 2021-04-18 MED ORDER — METOPROLOL TARTRATE 50 MG PO TABS: 50.0000 mg | ORAL_TABLET | Freq: Two times a day (BID) | ORAL | 3 refills | Status: DC

## 2021-04-18 NOTE — Patient Instructions (Addendum)
Medication Instructions:  Your physician has recommended you make the following change in your medication:  1-STOP coumadin (warfarin) 2-STOP atenolol 3-STOP amlodipine 4-STOP aspirin 5-START Cardizem 240 mg by mouth daily 6-START Metoprolol 50 mg by mouth twice daily 7-START Eliquis 5 mg by mouth twice daily (After we get your lab work back)  *If you need a refill on your cardiac medications before your next appointment, please call your pharmacy*  Lab Work: Your physician recommends that you have lab work today-BMET and CBC  If you have labs (blood work) drawn today and your tests are completely normal, you will receive your results only by: [place] (if you have [facility]) OR A paper copy in the mail If you have any lab test that is abnormal or we need to change your treatment, we will call you to review the results.  Testing/Procedures: Your physician has requested that you have an echocardiogram. Echocardiography is a painless test that uses sound waves to create images of your heart. It provides your doctor with information about the size and shape of your heart and how well your hearts chambers and valves are working. This procedure takes approximately one hour. There are no restrictions for this procedure.  Your physician has recommended that you have a Cardioversion (DCCV). Electrical Cardioversion uses a jolt of electricity to your heart either through paddles or wired patches attached to your chest. This is a controlled, usually prescheduled, procedure. Defibrillation is done under light anesthesia in the hospital, and you usually go home the day of the procedure. This is done to get your heart back into a normal rhythm. You are not awake for the procedure. Please see the instruction sheet given to you today.  Follow-Up: At [facility], you and your health needs are our priority.  As part of our continuing mission to provide you with exceptional heart care, we have  created designated Provider Care Teams.  These Care Teams include your primary Cardiologist (physician) and Advanced Practice Providers (APPs -  Physician Assistants and Nurse Practitioners) who all work together to provide you with the care you need, when you need it.  We recommend signing up for the patient portal called "MyChart".  Sign up information is provided on this After Visit Summary.  MyChart is used to connect with patients for Virtual Visits (Telemedicine).  Patients are able to view lab/test results, encounter notes, upcoming appointments, etc.  Non-urgent messages can be sent to your provider as well.   To learn more about what you can do with MyChart, go to [email or web address].    Your next appointment:   5 week(s)  The format for your next appointment:   In Person  Provider:   You will follow up in the [facility] located at [facility]. Your provider will be: Roderic Palau, NP    You are scheduled for a Cardioversion on 05/09/21 with Dr. Sallyanne Kuster.  Please arrive at the [facility] (Main Entrance A) at [facility]: [address] [place], [place] [address] at 10:30 am.   DIET: Nothing to eat or drink after midnight except a sip of water with medications (see medication instructions below)  FYI: For your safety, and to allow [place] to monitor your vital signs accurately during the surgery/procedure we request that   if you have artificial nails, gel coating, SNS etc. Please have those removed prior to your surgery/procedure. Not having the nail coverings /polish removed may result in cancellation or delay of your surgery/procedure.  Medication Instructions: Continue your anticoagulant: Eliquis You will need to continue your anticoagulant after your procedure until you  are told by your  Provider that it is safe to stop   Labs:  Come to the lab at [facility] between the hours of 8:00 am and 4:30 pm on 05/08/21 You do not have to  be fasting.  You must have a responsible person to drive you home and stay in the waiting area during your procedure. Failure to do so could result in cancellation.  Bring your insurance cards.  *Special Note: Every effort is made to have your procedure done on time. Occasionally there are emergencies that occur at the hospital that may cause delays. Please be patient if a delay does occur.

## 2021-04-19 LAB — CBC WITH DIFFERENTIAL/PLATELET
Basophils Absolute: 0.1 10*3/uL (ref 0.0–0.2)
Basos: 1 %
EOS (ABSOLUTE): 0.1 10*3/uL (ref 0.0–0.4)
Eos: 1 %
Hematocrit: 42.4 % (ref 34.0–46.6)
Hemoglobin: 14.3 g/dL (ref 11.1–15.9)
Immature Grans (Abs): 0 10*3/uL (ref 0.0–0.1)
Immature Granulocytes: 0 %
Lymphocytes Absolute: 2.4 10*3/uL (ref 0.7–3.1)
Lymphs: 36 %
MCH: 26.9 pg (ref 26.6–33.0)
MCHC: 33.7 g/dL (ref 31.5–35.7)
MCV: 80 fL (ref 79–97)
Monocytes Absolute: 0.8 10*3/uL (ref 0.1–0.9)
Monocytes: 12 %
Neutrophils Absolute: 3.4 10*3/uL (ref 1.4–7.0)
Neutrophils: 50 %
Platelets: 292 10*3/uL (ref 150–450)
RBC: 5.31 x10E6/uL — ABNORMAL HIGH (ref 3.77–5.28)
RDW: 18.1 % — ABNORMAL HIGH (ref 11.7–15.4)
WBC: 6.8 10*3/uL (ref 3.4–10.8)

## 2021-04-19 LAB — BASIC METABOLIC PANEL
BUN/Creatinine Ratio: 13 (ref 12–28)
BUN: 10 mg/dL (ref 8–27)
CO2: 28 mmol/L (ref 20–29)
Calcium: 9.8 mg/dL (ref 8.7–10.3)
Chloride: 101 mmol/L (ref 96–106)
Creatinine, Ser: 0.8 mg/dL (ref 0.57–1.00)
Glucose: 91 mg/dL (ref 70–99)
Potassium: 4.1 mmol/L (ref 3.5–5.2)
Sodium: 141 mmol/L (ref 134–144)
eGFR: 81 mL/min/{1.73_m2} (ref >59)

## 2021-04-21 ENCOUNTER — Other Ambulatory Visit: Payer: Self-pay | Admitting: Cardiovascular Disease

## 2021-04-21 DIAGNOSIS — I480 Paroxysmal atrial fibrillation: Secondary | ICD-10-CM

## 2021-05-01 ENCOUNTER — Encounter (HOSPITAL_COMMUNITY): Payer: Self-pay | Admitting: Cardiovascular Disease

## 2021-05-01 NOTE — Progress Notes (Signed)
Attempted to obtain medical history via telephone, unable to reach at this time. I left a voicemail to return pre surgical testing department's phone call.  

## 2021-05-03 ENCOUNTER — Other Ambulatory Visit: Payer: Medicare Other | Admitting: *Deleted

## 2021-05-03 ENCOUNTER — Ambulatory Visit (HOSPITAL_COMMUNITY): Payer: Medicare Other | Attending: Cardiovascular Disease

## 2021-05-03 ENCOUNTER — Other Ambulatory Visit: Payer: Self-pay

## 2021-05-03 DIAGNOSIS — I10 Essential (primary) hypertension: Secondary | ICD-10-CM

## 2021-05-03 DIAGNOSIS — E782 Mixed hyperlipidemia: Secondary | ICD-10-CM

## 2021-05-03 DIAGNOSIS — I480 Paroxysmal atrial fibrillation: Secondary | ICD-10-CM | POA: Diagnosis not present

## 2021-05-03 DIAGNOSIS — Z5189 Encounter for other specified aftercare: Secondary | ICD-10-CM

## 2021-05-03 LAB — CBC WITH DIFFERENTIAL/PLATELET
Basophils Absolute: 0.1 10*3/uL (ref 0.0–0.2)
Basos: 1 %
EOS (ABSOLUTE): 0.1 10*3/uL (ref 0.0–0.4)
Eos: 1 %
Hematocrit: 40.5 % (ref 34.0–46.6)
Hemoglobin: 13.6 g/dL (ref 11.1–15.9)
Lymphocytes Absolute: 1.8 10*3/uL (ref 0.7–3.1)
Lymphs: 23 %
MCH: 26.2 pg — ABNORMAL LOW (ref 26.6–33.0)
MCHC: 33.6 g/dL (ref 31.5–35.7)
MCV: 78 fL — ABNORMAL LOW (ref 79–97)
Monocytes Absolute: 0.9 10*3/uL (ref 0.1–0.9)
Monocytes: 12 %
Neutrophils Absolute: 4.8 10*3/uL (ref 1.4–7.0)
Neutrophils: 63 %
Platelets: 276 10*3/uL (ref 150–450)
RBC: 5.19 x10E6/uL (ref 3.77–5.28)
RDW: 17.1 % — ABNORMAL HIGH (ref 11.7–15.4)
WBC: 7.5 10*3/uL (ref 3.4–10.8)

## 2021-05-03 LAB — BASIC METABOLIC PANEL
BUN/Creatinine Ratio: 16 (ref 12–28)
BUN: 13 mg/dL (ref 8–27)
CO2: 32 mmol/L — ABNORMAL HIGH (ref 20–29)
Calcium: 10.3 mg/dL (ref 8.7–10.3)
Chloride: 101 mmol/L (ref 96–106)
Creatinine, Ser: 0.8 mg/dL (ref 0.57–1.00)
Glucose: 108 mg/dL — ABNORMAL HIGH (ref 70–99)
Potassium: 4.1 mmol/L (ref 3.5–5.2)
Sodium: 138 mmol/L (ref 134–144)
eGFR: 81 mL/min/{1.73_m2} (ref >59)

## 2021-05-03 LAB — ECHOCARDIOGRAM COMPLETE
Area-P 1/2: 4.53 cm2
P 1/2 time: 433 msec
S' Lateral: 4.2 cm

## 2021-05-09 ENCOUNTER — Ambulatory Visit (HOSPITAL_COMMUNITY)
Admission: RE | Admit: 2021-05-09 | Discharge: 2021-05-09 | Disposition: A | Discharge status: Home / Self Care | Payer: Medicare Other | Attending: Cardiovascular Disease | Admitting: Cardiovascular Disease

## 2021-05-09 ENCOUNTER — Encounter (HOSPITAL_COMMUNITY)
Admission: RE | Disposition: A | Discharge status: Home / Self Care | Payer: Self-pay | Source: Home / Self Care | Attending: Cardiovascular Disease

## 2021-05-09 ENCOUNTER — Ambulatory Visit (HOSPITAL_COMMUNITY): Payer: Medicare Other | Admitting: Certified Registered Nurse Anesthetist

## 2021-05-09 ENCOUNTER — Encounter (HOSPITAL_COMMUNITY): Payer: Self-pay | Admitting: Cardiovascular Disease

## 2021-05-09 ENCOUNTER — Ambulatory Visit (HOSPITAL_BASED_OUTPATIENT_CLINIC_OR_DEPARTMENT_OTHER): Payer: Medicare Other | Admitting: Certified Registered Nurse Anesthetist

## 2021-05-09 ENCOUNTER — Other Ambulatory Visit: Payer: Self-pay

## 2021-05-09 DIAGNOSIS — Z6839 Body mass index (BMI) 39.0-39.9, adult: Secondary | ICD-10-CM | POA: Insufficient documentation

## 2021-05-09 DIAGNOSIS — E119 Type 2 diabetes mellitus without complications: Secondary | ICD-10-CM

## 2021-05-09 DIAGNOSIS — I4891 Unspecified atrial fibrillation: Secondary | ICD-10-CM | POA: Diagnosis not present

## 2021-05-09 DIAGNOSIS — Z7901 Long term (current) use of anticoagulants: Secondary | ICD-10-CM | POA: Diagnosis not present

## 2021-05-09 DIAGNOSIS — F32A Depression, unspecified: Secondary | ICD-10-CM | POA: Insufficient documentation

## 2021-05-09 DIAGNOSIS — F419 Anxiety disorder, unspecified: Secondary | ICD-10-CM | POA: Diagnosis not present

## 2021-05-09 DIAGNOSIS — I480 Paroxysmal atrial fibrillation: Secondary | ICD-10-CM

## 2021-05-09 DIAGNOSIS — Z79899 Other long term (current) drug therapy: Secondary | ICD-10-CM | POA: Diagnosis not present

## 2021-05-09 DIAGNOSIS — E785 Hyperlipidemia, unspecified: Secondary | ICD-10-CM | POA: Diagnosis not present

## 2021-05-09 DIAGNOSIS — I4819 Other persistent atrial fibrillation: Secondary | ICD-10-CM

## 2021-05-09 DIAGNOSIS — I10 Essential (primary) hypertension: Secondary | ICD-10-CM | POA: Diagnosis not present

## 2021-05-09 DIAGNOSIS — E669 Obesity, unspecified: Secondary | ICD-10-CM | POA: Diagnosis not present

## 2021-05-09 DIAGNOSIS — K219 Gastro-esophageal reflux disease without esophagitis: Secondary | ICD-10-CM | POA: Insufficient documentation

## 2021-05-09 HISTORY — PX: CARDIOVERSION: SHX1299

## 2021-05-09 LAB — GLUCOSE, CAPILLARY: Glucose-Capillary: 101 mg/dL — ABNORMAL HIGH (ref 70–99)

## 2021-05-09 SURGERY — CARDIOVERSION
Anesthesia: General

## 2021-05-09 MED ORDER — METOPROLOL TARTRATE 50 MG PO TABS: 25.0000 mg | ORAL_TABLET | Freq: Two times a day (BID) | ORAL | 3 refills | Status: DC

## 2021-05-09 MED ORDER — LIDOCAINE 2% (20 MG/ML) 5 ML SYRINGE
INTRAMUSCULAR | Status: DC | PRN
  Administered 2021-05-09: 80 mg via INTRAVENOUS

## 2021-05-09 MED ORDER — SODIUM CHLORIDE 0.9 % IV SOLN: INTRAVENOUS | Status: DC

## 2021-05-09 MED ORDER — PROPOFOL 10 MG/ML IV BOLUS
INTRAVENOUS | Status: DC | PRN
  Administered 2021-05-09: 80 mg via INTRAVENOUS

## 2021-05-09 NOTE — Anesthesia Preprocedure Evaluation (Signed)
Anesthesia Evaluation  ?Patient identified by MRN, date of birth, ID band ?Patient awake ? ? ? ?Reviewed: ?Allergy & Precautions, NPO status , Patient's Chart, lab work & pertinent test results ? ?Airway ?Mallampati: II ? ?TM Distance: >3 FB ?Neck ROM: Full ? ? ? Dental ?no notable dental hx. ? ?  ?Pulmonary ?neg pulmonary ROS,  ?  ?Pulmonary exam normal ?breath sounds clear to auscultation ? ? ? ? ? ? Cardiovascular ?hypertension, Pt. on medications ?Normal cardiovascular exam+ dysrhythmias Atrial Fibrillation  ?Rhythm:Regular Rate:Normal ? ? ?  ?Neuro/Psych ?Anxiety negative neurological ROS ? negative psych ROS  ? GI/Hepatic ?Neg liver ROS, GERD  ,  ?Endo/Other  ?negative endocrine ROSdiabetes, Type 2 ? Renal/GU ?negative Renal ROS  ?negative genitourinary ?  ?Musculoskeletal ? ?(+) Arthritis , Osteoarthritis,   ? Abdominal ?(+) + obese,   ?Peds ?negative pediatric ROS ?(+)  Hematology ?negative hematology ROS ?(+)   ?Anesthesia Other Findings ? ? Reproductive/Obstetrics ?negative OB ROS ? ?  ? ? ? ? ? ? ? ? ? ? ? ? ? ?  ?  ? ? ? ? ? ? ? ? ?Anesthesia Physical ?Anesthesia Plan ? ?ASA: 3 ? ?Anesthesia Plan: General  ? ?Post-op Pain Management: Minimal or no pain anticipated  ? ?Induction: Intravenous ? ?PONV Risk Score and Plan: 3 and Ondansetron, Dexamethasone, Midazolam and Treatment may vary due to age or medical condition ? ?Airway Management Planned: Mask ? ?Additional Equipment:  ? ?Intra-op Plan:  ? ?Post-operative Plan:  ? ?Informed Consent: I have reviewed the patients History and Physical, chart, labs and discussed the procedure including the risks, benefits and alternatives for the proposed anesthesia with the patient or authorized representative who has indicated his/her understanding and acceptance.  ? ? ? ?Dental advisory given ? ?Plan Discussed with: CRNA ? ?Anesthesia Plan Comments:   ? ? ? ? ? ? ?Anesthesia Quick Evaluation ? ?

## 2021-05-09 NOTE — Anesthesia Postprocedure Evaluation (Signed)
Anesthesia Post Note ? ?Patient: Emily Andersen ? ?Procedure(s) Performed: CARDIOVERSION ? ?  ? ?Patient location during evaluation: PACU ?Anesthesia Type: General ?Level of consciousness: awake and alert ?Pain management: pain level controlled ?Vital Signs Assessment: post-procedure vital signs reviewed and stable ?Respiratory status: spontaneous breathing, nonlabored ventilation and respiratory function stable ?Cardiovascular status: blood pressure returned to baseline and stable ?Postop Assessment: no apparent nausea or vomiting ?Anesthetic complications: no ? ? ?No notable events documented. ? ?Last Vitals:  ?Vitals:  ? 05/09/21 1020 05/09/21 1027  ?BP: (!) 101/57 127/65  ?Pulse: (!) 42 (!) 51  ?Resp: (!) 24 (!) 24  ?Temp:    ?SpO2: 97% 100%  ?  ?Last Pain:  ?Vitals:  ? 05/09/21 1027  ?TempSrc:   ?PainSc: 0-No pain  ? ? ?  ?  ?  ?  ?  ?  ? ?Lynda Rainwater ? ? ? ? ?

## 2021-05-09 NOTE — Op Note (Signed)
Procedure: Electrical Cardioversion ?Indications:  Atrial Fibrillation ? ?Procedure Details: ? ?Consent: Risks of procedure as well as the alternatives and risks of each were explained to the (patient/caregiver).  Consent for procedure obtained. ? ?Time Out: Verified patient identification, verified procedure, site/side was marked, verified correct patient position, special equipment/implants available, medications/allergies/relevent history reviewed, required imaging and test results available.  Performed ? ?Patient placed on cardiac monitor, pulse oximetry, supplemental oxygen as necessary.  ?Sedation given:  propofol 80 mg IV ?Pacer pads placed anterior and posterior chest. ? ?Cardioverted 1 time(s).  ?Cardioversion with synchronized biphasic 120J shock. ? ?Evaluation: ?Findings: Post procedure EKG shows:  sinus bradycardia in the 40s, very frequent PACs ?Complications: None ?Patient did tolerate procedure well. ? ?Time Spent Directly with the Patient: ? ?30 minutes  ? ?Emily Andersen ?05/09/2021, 10:05 AM ? ? ? ? ?

## 2021-05-09 NOTE — Interval H&P Note (Signed)
History and Physical Interval Note: ? ?05/09/2021 ?9:07 AM ? ?Emily Andersen  has presented today for surgery, with the diagnosis of AFIB.  The various methods of treatment have been discussed with the patient and family. After consideration of risks, benefits and other options for treatment, the patient has consented to  Procedure(s): ?CARDIOVERSION (N/A) as a surgical intervention.  The patient's history has been reviewed, patient examined, no change in status, stable for surgery.  I have reviewed the patient's chart and labs.  Questions were answered to the patient's satisfaction.   ? ? ?Kristjan Derner ? ? ?

## 2021-05-09 NOTE — Transfer of Care (Signed)
Immediate Anesthesia Transfer of Care Note ? ?Patient: Emily Andersen ? ?Procedure(s) Performed: CARDIOVERSION ? ?Patient Location: Endoscopy Unit ? ?Anesthesia Type:General ? ?Level of Consciousness: awake and drowsy ? ?Airway & Oxygen Therapy: Patient Spontanous Breathing ? ?Post-op Assessment: Report given to RN and Post -op Vital signs reviewed and stable ? ?Post vital signs: Reviewed and stable ? ?Last Vitals:  ?Vitals Value Taken Time  ?BP    ?Temp    ?Pulse    ?Resp    ?SpO2    ? ? ?Last Pain:  ?Vitals:  ? 05/09/21 0932  ?TempSrc: Temporal  ?PainSc: 0-No pain  ?   ? ?  ? ?Complications: No notable events documented. ?

## 2021-05-10 ENCOUNTER — Encounter (HOSPITAL_COMMUNITY): Payer: Self-pay | Admitting: Cardiovascular Disease

## 2021-05-24 ENCOUNTER — Ambulatory Visit (HOSPITAL_COMMUNITY)
Admission: RE | Admit: 2021-05-24 | Discharge: 2021-05-24 | Disposition: A | Discharge status: Home / Self Care | Payer: Medicare Other | Source: Ambulatory Visit | Attending: Nurse Practitioner | Admitting: Nurse Practitioner

## 2021-05-24 ENCOUNTER — Encounter (HOSPITAL_COMMUNITY): Payer: Self-pay | Admitting: Nurse Practitioner

## 2021-05-24 VITALS — BP 150/72 | HR 39 | Ht 67.0 in | Wt 263.2 lb

## 2021-05-24 DIAGNOSIS — F419 Anxiety disorder, unspecified: Secondary | ICD-10-CM | POA: Diagnosis not present

## 2021-05-24 DIAGNOSIS — I4819 Other persistent atrial fibrillation: Secondary | ICD-10-CM | POA: Diagnosis not present

## 2021-05-24 DIAGNOSIS — Z7901 Long term (current) use of anticoagulants: Secondary | ICD-10-CM | POA: Insufficient documentation

## 2021-05-24 DIAGNOSIS — I4891 Unspecified atrial fibrillation: Secondary | ICD-10-CM | POA: Insufficient documentation

## 2021-05-24 DIAGNOSIS — E119 Type 2 diabetes mellitus without complications: Secondary | ICD-10-CM | POA: Insufficient documentation

## 2021-05-24 DIAGNOSIS — I10 Essential (primary) hypertension: Secondary | ICD-10-CM | POA: Insufficient documentation

## 2021-05-24 DIAGNOSIS — Z79899 Other long term (current) drug therapy: Secondary | ICD-10-CM | POA: Diagnosis not present

## 2021-05-24 DIAGNOSIS — R001 Bradycardia, unspecified: Secondary | ICD-10-CM

## 2021-05-24 MED ORDER — POTASSIUM CHLORIDE CRYS ER 20 MEQ PO TBCR: 20.0000 meq | EXTENDED_RELEASE_TABLET | Freq: Every day | ORAL | 1 refills | Status: DC | PRN

## 2021-05-24 MED ORDER — FUROSEMIDE 20 MG PO TABS: 20.0000 mg | ORAL_TABLET | Freq: Every day | ORAL | 1 refills | Status: DC | PRN

## 2021-05-24 NOTE — Patient Instructions (Signed)
Lasix '20mg'$  -- take 1 tablet daily for the next 5 days then only as needed for weight gain/swelling ? ?Potassium 89mq - take on days you take lasix only ?

## 2021-05-24 NOTE — Progress Notes (Addendum)
? ?Primary Care Physician: Sharilyn Sites, MD ?Referring Physician: Dr. Johnsie Cancel  ? ? ?Emily Andersen is a 66 y.o. female with a h/o DM, anxiety, HTN, with new onset afib seen by PCP 03/24/21 and was started on coumadin and atenolol. She was referred to Dr. Johnsie Cancel and she was switched to eliquis with a CHA2DS2VASc  score of 3, and Cardizem 240 mg daily was started with a BB. She had a successful cardioversion 3/21, with sinus brady in the low 40's, and is now in a junctional bradycardia at 39 bpm. She is not symptomatic with this. Her weight is noted to be up 10 lbs in just a few weeks. She has LLE and feels mildly bloated. Does not check weight at home. She states that she sleeps poorly, but denies snoring/apnea. Minimal caffeine, no alcohol/tobacco.  ? ?Today, she denies symptoms of palpitations, chest pain, shortness of breath, orthopnea, PND, lower extremity edema, dizziness, presyncope, syncope, or neurologic sequela. The patient is tolerating medications without difficulties and is otherwise without complaint today.  ? ?Past Medical History:  ?Diagnosis Date  ? Anemia   ? Anxiety   ? Arthritis   ? Diabetes ([place])   ? GERD (gastroesophageal reflux disease)   ? Hyperlipidemia   ? Hypertension   ? Obesity   ? Palpitations   ? Uterine fibroid   ? ?Past Surgical History:  ?Procedure Laterality Date  ? ABDOMINAL HYSTERECTOMY  1974  ? CARDIOVERSION N/A 05/09/2021  ? Procedure: CARDIOVERSION;  Surgeon: Sanda Klein, MD;  Location: [facility];  Service: Cardiovascular;  Laterality: N/A;  ? COLONOSCOPY    ? TOTAL KNEE ARTHROPLASTY Left 2006  ? UMBILICAL HERNIA REPAIR  2000  ? ? ?Current Outpatient Medications  ?Medication Sig Dispense Refill  ? ALPRAZolam (XANAX) 0.5 MG tablet Take 0.5 mg by mouth at bedtime as needed for anxiety.    ? apixaban (ELIQUIS) 5 MG TABS tablet Take 1 tablet (5 mg total) by mouth 2 (two) times daily. 60 tablet 11  ? Carboxymethylcellul-Glycerin (LUBRICATING EYE DROPS OP) Place 1 drop  into both eyes daily as needed (dry eyes).    ? Cholecalciferol (VITAMIN D3) 2000 UNITS TABS Take 2,000 Units by mouth daily.    ? diltiazem (CARDIZEM CD) 240 MG 24 hr capsule Take 1 capsule (240 mg total) by mouth daily. 90 capsule 3  ? escitalopram (LEXAPRO) 10 MG tablet Take 10 mg by mouth daily.    ? lisinopril-hydrochlorothiazide (ZESTORETIC) 20-25 MG tablet Take 1 tablet by mouth daily.    ? metoprolol tartrate (LOPRESSOR) 50 MG tablet Take 0.5 tablets (25 mg total) by mouth 2 (two) times daily. 180 tablet 3  ? mirtazapine (REMERON) 15 MG tablet Take 15 mg by mouth at bedtime.    ? Multiple Vitamins-Minerals (CENTRUM SILVER PO) Take 1 tablet by mouth daily.    ? Omega-3 Fatty Acids (FISH OIL OMEGA-3 PO) Take 1 tablet by mouth every morning.    ? omeprazole (PRILOSEC OTC) 20 MG tablet Take 20 mg by mouth daily.    ? pravastatin (PRAVACHOL) 20 MG tablet Take 20 mg by mouth daily.    ? pyridOXINE (VITAMIN B-6) 100 MG tablet Take 100 mg by mouth daily.    ? terbinafine (LAMISIL) 250 MG tablet Take 250 mg by mouth daily.    ? vitamin B-12 (CYANOCOBALAMIN) 500 MCG tablet Take 500 mcg by mouth daily.    ? ?No current facility-administered medications for this encounter.  ? ? ?No Known Allergies ? ?Social History  ? ?  Socioeconomic History  ? Marital status: Married  ?  Spouse name: Not on file  ? Number of children: 1  ? Years of education: Not on file  ? Highest education level: Not on file  ?Occupational History  ? Occupation: retired Quarry manager  ?Tobacco Use  ? Smoking status: Never  ? Smokeless tobacco: Never  ?Vaping Use  ? Vaping Use: Never used  ?Substance and Sexual Activity  ? Alcohol use: No  ? Drug use: No  ? Sexual activity: Not on file  ?Other Topics Concern  ? Not on file  ?Social History Narrative  ? Not on file  ? ?Social Determinants of Health  ? ?Financial Resource Strain: Not on file  ?Food Insecurity: Not on file  ?Transportation Needs: Not on file  ?Physical Activity: Not on file  ?Stress: Not on file   ?Social Connections: Not on file  ?Intimate Partner Violence: Not on file  ? ? ?Family History  ?Problem Relation Age of Onset  ? Heart disease Father   ? Alzheimer's disease Mother   ? Colon polyps Sister   ? Breast cancer Sister   ? Emphysema Sister   ?     smoker  ? Emphysema Brother   ?     smoker  ? Cancer Maternal Grandmother   ?     type unknown  ? ? ?ROS- All systems are reviewed and negative except as per the HPI above ? ?Physical Exam: ?Vitals:  ? 05/24/21 0919  ?BP: (!) 150/72  ?Pulse: (!) 39  ?Weight: 119.4 kg  ?Height: '5\' 7"'$  (1.702 m)  ? ?Wt Readings from Last 3 Encounters:  ?05/24/21 119.4 kg  ?05/09/21 114.8 kg  ?04/18/21 115.7 kg  ? ? ?Labs: ?Lab Results  ?Component Value Date  ? NA 138 05/03/2021  ? K 4.1 05/03/2021  ? CL 101 05/03/2021  ? CO2 32 (H) 05/03/2021  ? GLUCOSE 108 (H) 05/03/2021  ? BUN 13 05/03/2021  ? CREATININE 0.80 05/03/2021  ? CALCIUM 10.3 05/03/2021  ? ?No results found for: INR ?No results found for: CHOL, HDL, LDLCALC, TRIG ? ? ?GEN- The patient is well appearing, alert and oriented x 3 today.   ?Head- normocephalic, atraumatic ?Eyes-  Sclera clear, conjunctiva pink ?Ears- hearing intact ?Oropharynx- clear ?Neck- supple, no JVP ?Lymph- no cervical lymphadenopathy ?Lungs- Clear to ausculation bilaterally, normal work of breathing ?Heart- Regular rate and rhythm, no murmurs, rubs or gallops, PMI not laterally displaced ?GI- soft, NT, ND, + BS ?Extremities- no clubbing, cyanosis, or  1+ edema ?MS- no significant deformity or atrophy ?Skin- no rash or lesion ?Psych- euthymic mood, full affect ?Neuro- strength and sensation are intact ? ?EKG-probable junctional rhythm at 39 bpm, qrs int 82 ms, qtc 413 ms  ? ?Epic records revealed ? ?Assessment and Plan:  ?1. New onset  afib in winter of 2023 ?Recently cardioverted and is now in a slow junctional rhythm  ?He is asymptomatic  ?Will keep her rate control meds the same for now with concern if I cut back rate control, afib may  return ?Will place a 2 week zio patch  ? ?2. Fluid weight gain ?Weight is up 10 lbs from last of March  ?Will prescribe lasix 20 mg x 5 days with 20 meg of K+ ? ?3. CHA2DS2VASc  score of 3 ?Continue eliquis 5 mg bid   ? ?She will return next week for weight/ekg/bp check  ? ?Geroge Baseman Kayleen Memos, ANP-C ?Afib Clinic ?[facility] ?[address] ?[place], [place] [address] ?[phone]  ?

## 2021-05-30 ENCOUNTER — Ambulatory Visit (HOSPITAL_COMMUNITY)
Admission: RE | Admit: 2021-05-30 | Discharge: 2021-05-30 | Disposition: A | Discharge status: Home / Self Care | Payer: Medicare Other | Source: Ambulatory Visit | Attending: Nurse Practitioner | Admitting: Nurse Practitioner

## 2021-05-30 ENCOUNTER — Encounter (HOSPITAL_COMMUNITY): Payer: Self-pay | Admitting: Nurse Practitioner

## 2021-05-30 VITALS — BP 158/76 | HR 38 | Wt 261.6 lb

## 2021-05-30 DIAGNOSIS — D6869 Other thrombophilia: Secondary | ICD-10-CM | POA: Diagnosis not present

## 2021-05-30 DIAGNOSIS — Z5181 Encounter for therapeutic drug level monitoring: Secondary | ICD-10-CM | POA: Insufficient documentation

## 2021-05-30 DIAGNOSIS — I4819 Other persistent atrial fibrillation: Secondary | ICD-10-CM | POA: Diagnosis not present

## 2021-05-30 DIAGNOSIS — Z79899 Other long term (current) drug therapy: Secondary | ICD-10-CM | POA: Insufficient documentation

## 2021-05-30 MED ORDER — METOPROLOL TARTRATE 25 MG PO TABS: 12.5000 mg | ORAL_TABLET | Freq: Two times a day (BID) | ORAL | 3 refills | Status: DC

## 2021-05-30 NOTE — Patient Instructions (Signed)
Decrease metoprolol to 1/2 tablet of the '25mg'$  tablet twice a day  ?

## 2021-05-30 NOTE — Progress Notes (Signed)
Emily Andersen in today for f/u BP/HR/weight. Her weight was up 10 lbs on last visit and she was placed on  a few days of lasix but her weight is only down one lb. She is not short of breath. ankles/LL's soft. She feels this weight gain is true weight from  having her knee replaced last fall. Her  BP is 158/76 today. Her HR is still a junctional rhythm with rates in the upper 30's. She is not symptomatic with this. She feels good and is still wearing a monitor but I will lower the metoprolol to 12.5 mg bid to allow more heart rate. I will determine further plan when I review her monitor.   ?

## 2021-06-08 DIAGNOSIS — E119 Type 2 diabetes mellitus without complications: Secondary | ICD-10-CM | POA: Diagnosis not present

## 2021-06-08 DIAGNOSIS — Z0001 Encounter for general adult medical examination with abnormal findings: Secondary | ICD-10-CM | POA: Diagnosis not present

## 2021-06-08 DIAGNOSIS — I10 Essential (primary) hypertension: Secondary | ICD-10-CM | POA: Diagnosis not present

## 2021-06-08 DIAGNOSIS — M159 Polyosteoarthritis, unspecified: Secondary | ICD-10-CM | POA: Diagnosis not present

## 2021-06-08 DIAGNOSIS — E782 Mixed hyperlipidemia: Secondary | ICD-10-CM | POA: Diagnosis not present

## 2021-06-09 DIAGNOSIS — R001 Bradycardia, unspecified: Secondary | ICD-10-CM | POA: Diagnosis not present

## 2021-06-12 ENCOUNTER — Telehealth: Payer: Self-pay | Admitting: Cardiovascular Disease

## 2021-06-12 NOTE — Telephone Encounter (Signed)
New Message: ? ? ? ?Patient says she would like to get an application to apply for assistance with her Eliquis please, ?

## 2021-06-12 NOTE — Telephone Encounter (Signed)
**Note De-Identified Emily Andersen Obfuscation** The pt and I discussed her applying for pt asst through [facility] for her Eliquis and she is interested. ? ?Per her request (because she was driving at the time) I called her back and left a message on her VM with [employer] phone number and requested that she call them to ask questions concerning her eligibility to be approved for their Eliquis program and that if it appears that she is eligible to request that they mail her an application to her home address. ?I also requested that once she receives the application to compete her part of it, obtain required documents per [employer], and to bring all to Dr Elmarie Shiley office to drop off and that we will take care of the providers page of her application and will fax all to [employer]. ? ?I also left my name and the office phone number in the message so she can call back if she has any questions or needs assistance. ? ?

## 2021-06-15 ENCOUNTER — Other Ambulatory Visit (HOSPITAL_COMMUNITY): Payer: Self-pay | Admitting: Nurse Practitioner

## 2021-06-16 ENCOUNTER — Other Ambulatory Visit (HOSPITAL_COMMUNITY): Payer: Self-pay | Admitting: Nurse Practitioner

## 2021-06-19 ENCOUNTER — Telehealth (HOSPITAL_COMMUNITY): Payer: Self-pay | Admitting: *Deleted

## 2021-06-19 ENCOUNTER — Encounter (HOSPITAL_COMMUNITY): Payer: Self-pay | Admitting: *Deleted

## 2021-06-19 NOTE — Telephone Encounter (Signed)
-----   Message from Sherran Needs, NP sent at 06/13/2021  1:12 PM EDT ----- ?Please let pt know that monitor showed overall rhythm is normal rhythm, very few fast episodes, lasting just secs. No significant slow heart rate noted. Monitor overall ok.  ?----- Message ----- ?From: Juluis Mire, RN ?Sent: 06/09/2021   4:13 PM EDT ?To: Sherran Needs, NP ? ? ?

## 2021-06-19 NOTE — Telephone Encounter (Signed)
Pt.notified

## 2021-06-26 ENCOUNTER — Other Ambulatory Visit: Payer: Self-pay | Admitting: *Deleted

## 2021-06-26 MED ORDER — DILTIAZEM HCL ER COATED BEADS 240 MG PO CP24: 240.0000 mg | ORAL_CAPSULE | Freq: Every day | ORAL | 2 refills | Status: DC

## 2021-07-02 ENCOUNTER — Other Ambulatory Visit (HOSPITAL_COMMUNITY): Payer: Self-pay | Admitting: Nurse Practitioner

## 2021-07-19 ENCOUNTER — Telehealth: Payer: Self-pay

## 2021-07-19 NOTE — Telephone Encounter (Signed)
Received form will get signature tomorrow when Dr. Johnsie Cancel is in the office.

## 2021-07-19 NOTE — Telephone Encounter (Signed)
**Note De-Identified Aizza Santiago Obfuscation** The pts completed [employer] application for Eliquis was left at the office with financial document.  I have completed the providers page of her application and have e-mailed all to Dr Kyla Balzarine nurse so she can obtain his signature, date it, and to fax all to [facility] at the fax number written on the cover letter included.

## 2021-07-21 NOTE — Telephone Encounter (Signed)
Faxed signed paper work to number provided.

## 2021-10-02 NOTE — Progress Notes (Unsigned)
CARDIOLOGY CONSULT NOTE       Patient ID: Emily Andersen MRN: [id number] DOB/AGE: Sep 28, 1955 66 y.o.  Admit date: (Not on file) Referring Physician: Gerarda Fraction Primary Physician: Sharilyn Sites, MD Primary Cardiologist: Johnsie Cancel   HPI:  66 y.o. referred by Dr Gerarda Fraction for PAF First seen on 04/18/21 History of DM, Anxiety, HTN and palpitations Seen in office by primary 03/24/21 thought to be in new onset afib. Started on coumadin and atenolol Poor quality tracing from their office shows fib/flutter rate 86 bpm nonspecific ST change Started on lopressor and eliquis [facility] on 05/09/21 with Dr Sallyanne Kuster Had frequent PACls and post conversion SB rates 40's Monitor with some junctional rhythm lopressor dose decreased TTE 05/03/21 with EF 50-55% no valve dx LA 51 mm   Still on cardizem 240 mg daily and Lopressor 12.5 mg bid   ECG today showed ERAF rate 112 She does not realized it but given post [facility] PACls suspected   Discussed options of rate control/anticoagulation vs AAT Favor AAT Discussed options and she would appear to be a good candidate for outpatient initiation of flecainide Discussed pro arrhythmia and need for ETT. She has been compliant with eliquis with no missed doses     ROS All other systems reviewed and negative except as noted above  Past Medical History:  Diagnosis Date   Anemia    Anxiety    Arthritis    Diabetes ([facility])    GERD (gastroesophageal reflux disease)    Hyperlipidemia    Hypertension    Obesity    Palpitations    Uterine fibroid     Family History  Problem Relation Age of Onset   Heart disease Father    Alzheimer's disease Mother    Colon polyps Sister    Breast cancer Sister    Emphysema Sister        smoker   Emphysema Brother        smoker   Cancer Maternal Grandmother        type unknown    Social History   Socioeconomic History   Marital status: Married    Spouse name: Not on file   Number of children: 1   Years of education: Not on file    Highest education level: Not on file  Occupational History   Occupation: retired Quarry manager  Tobacco Use   Smoking status: Never   Smokeless tobacco: Never  Vaping Use   Vaping Use: Never used  Substance and Sexual Activity   Alcohol use: No   Drug use: No   Sexual activity: Not on file  Other Topics Concern   Not on file  Social History Narrative   Not on file   Social Determinants of Health   Financial Resource Strain: Not on file  Food Insecurity: Not on file  Transportation Needs: Not on file  Physical Activity: Not on file  Stress: Not on file  Social Connections: Not on file  Intimate Partner Violence: Not on file    Past Surgical History:  Procedure Laterality Date   ABDOMINAL HYSTERECTOMY  1974   CARDIOVERSION N/A 05/09/2021   Procedure: CARDIOVERSION;  Surgeon: Sanda Klein, MD;  Location: [place];  Service: Cardiovascular;  Laterality: N/A;   COLONOSCOPY     TOTAL KNEE ARTHROPLASTY Left 6144   UMBILICAL HERNIA REPAIR  2000      Current Outpatient Medications:    ALPRAZolam (XANAX) 0.5 MG tablet, Take 0.5 mg by mouth at bedtime as needed for anxiety., Disp: ,  Rfl:    apixaban (ELIQUIS) 5 MG TABS tablet, Take 1 tablet (5 mg total) by mouth 2 (two) times daily., Disp: 60 tablet, Rfl: 11   Carboxymethylcellul-Glycerin (LUBRICATING EYE DROPS OP), Place 1 drop into both eyes daily as needed (dry eyes)., Disp: , Rfl:    Cholecalciferol (VITAMIN D3) 2000 UNITS TABS, Take 2,000 Units by mouth daily., Disp: , Rfl:    diltiazem (CARDIZEM CD) 240 MG 24 hr capsule, Take 1 capsule (240 mg total) by mouth daily., Disp: 90 capsule, Rfl: 2   escitalopram (LEXAPRO) 10 MG tablet, Take 10 mg by mouth daily., Disp: , Rfl:    furosemide (LASIX) 20 MG tablet, TAKE 1 TABLET BY MOUTH DAILY AS NEEDED (SWELLING/WEIGHT GAIN)., Disp: 30 tablet, Rfl: 1   KLOR-CON M20 20 MEQ tablet, TAKE 1 TABLET (20 MEQ TOTAL) BY MOUTH DAILY AS NEEDED (WHEN LASIX IS TAKEN)., Disp: 30 tablet, Rfl: 1    lisinopril-hydrochlorothiazide (ZESTORETIC) 20-25 MG tablet, Take 1 tablet by mouth daily., Disp: , Rfl:    metoprolol tartrate (LOPRESSOR) 25 MG tablet, TAKE 0.5 TABLETS BY MOUTH 2 TIMES DAILY., Disp: 90 tablet, Rfl: 3   mirtazapine (REMERON) 15 MG tablet, Take 15 mg by mouth at bedtime., Disp: , Rfl:    Multiple Vitamins-Minerals (CENTRUM SILVER PO), Take 1 tablet by mouth daily., Disp: , Rfl:    Omega-3 Fatty Acids (FISH OIL OMEGA-3 PO), Take 1 tablet by mouth every morning., Disp: , Rfl:    omeprazole (PRILOSEC OTC) 20 MG tablet, Take 20 mg by mouth daily., Disp: , Rfl:    pravastatin (PRAVACHOL) 20 MG tablet, Take 20 mg by mouth daily., Disp: , Rfl:    pyridOXINE (VITAMIN B-6) 100 MG tablet, Take 100 mg by mouth daily., Disp: , Rfl:    terbinafine (LAMISIL) 250 MG tablet, Take 250 mg by mouth daily., Disp: , Rfl:    vitamin B-12 (CYANOCOBALAMIN) 500 MCG tablet, Take 500 mcg by mouth daily., Disp: , Rfl:     Physical Exam: Blood pressure 116/88, pulse (!) 112, height '5\' 7"'$  (1.702 m), weight 269 lb 12.8 oz (122.4 kg), SpO2 94 %.    Affect appropriate Overweight black female HEENT: normal Neck supple with no adenopathy JVP normal no bruits no thyromegaly Lungs clear with no wheezing and good diaphragmatic motion Heart:  S1/S2 no murmur, no rub, gallop or click PMI normal Abdomen: benighn, BS positve, no tenderness, no AAA no bruit.  No HSM or HJR Distal pulses intact with no bruits No edema Neuro non-focal Skin warm and dry No muscular weakness   Labs:   Lab Results  Component Value Date   WBC 7.5 05/03/2021   HGB 13.6 05/03/2021   HCT 40.5 05/03/2021   MCV 78 (L) 05/03/2021   PLT 276 05/03/2021   No results for input(s): "NA", "K", "CL", "CO2", "BUN", "CREATININE", "CALCIUM", "PROT", "BILITOT", "ALKPHOS", "ALT", "AST", "GLUCOSE" in the last 168 hours.  Invalid input(s): "LABALBU" No results found for: "CKTOTAL", "CKMB", "CKMBINDEX", "TROPONINI" No results found for:  "CHOL" No results found for: "HDL" No results found for: "LDLCALC" No results found for: "TRIG" No results found for: "CHOLHDL" No results found for: "LDLDIRECT"    Radiology: No results found.  EKG: afib rate 112 nonspecific ST changes QT 340 msec    ASSESSMENT AND PLAN:   New onset afib:  03/24/21  Post [place] on 05/09/21 with bradycardia /junctional escape Lopressor dose decreased Moderate LAE on TTE   CHADVASC 3  Recurrent PAF on ECG today  Start flecainide 75 mg bid ETT in 10-14 days FU afib clinic in 4 weeks arrange labs and [facility] if still in afib  HTN:  Well controlled.  Continue current medications and low sodium Dash type diet.   HLD:  continue statin  Anxiety/Depression:  continue Lexapro   Flecainide 75 bid  F/U in 3 months with me   Signed: Jenkins Rouge 10/03/2021, 8:43 AM

## 2021-10-03 ENCOUNTER — Ambulatory Visit: Payer: Medicare Other | Admitting: Cardiovascular Disease

## 2021-10-03 ENCOUNTER — Encounter: Payer: Self-pay | Admitting: Cardiovascular Disease

## 2021-10-03 VITALS — BP 116/88 | HR 112 | Ht 67.0 in | Wt 269.8 lb

## 2021-10-03 DIAGNOSIS — E782 Mixed hyperlipidemia: Secondary | ICD-10-CM

## 2021-10-03 DIAGNOSIS — I10 Essential (primary) hypertension: Secondary | ICD-10-CM | POA: Diagnosis not present

## 2021-10-03 DIAGNOSIS — I480 Paroxysmal atrial fibrillation: Secondary | ICD-10-CM

## 2021-10-03 MED ORDER — FLECAINIDE ACETATE 50 MG PO TABS: 75.0000 mg | ORAL_TABLET | Freq: Two times a day (BID) | ORAL | 3 refills | Status: DC

## 2021-10-03 MED ORDER — METOPROLOL TARTRATE 25 MG PO TABS: 25.0000 mg | ORAL_TABLET | Freq: Two times a day (BID) | ORAL | 3 refills | Status: DC

## 2021-10-03 NOTE — Patient Instructions (Signed)
Medication Instructions:  Your physician has recommended you make the following change in your medication:  1-START Flecainide 75 mg by mouth twice daily 2-Take Metoprolol 25 mg by mouth twice daily.  *If you need a refill on your cardiac medications before your next appointment, please call your pharmacy*  Lab Work: If you have labs (blood work) drawn today and your tests are completely normal, you will receive your results only by: [place] (if you have MyChart) OR A paper copy in the mail If you have any lab test that is abnormal or we need to change your treatment, we will call you to review the results.  Testing/Procedures: Your physician has requested that you have an exercise tolerance test in 7 to 10 days. For further information please visit [email or web address]. Please also follow instruction sheet, as given.  Follow-Up: At [facility], you and your health needs are our priority.  As part of our continuing mission to provide you with exceptional heart care, we have created designated Provider Care Teams.  These Care Teams include your primary Cardiologist (physician) and Advanced Practice Providers (APPs -  Physician Assistants and Nurse Practitioners) who all work together to provide you with the care you need, when you need it.  We recommend signing up for the patient portal called "MyChart".  Sign up information is provided on this After Visit Summary.  MyChart is used to connect with patients for Virtual Visits (Telemedicine).  Patients are able to view lab/test results, encounter notes, upcoming appointments, etc.  Non-urgent messages can be sent to your provider as well.   To learn more about what you can do with MyChart, go to [email or web address].    Your next appointment:   4 week(s)  The format for your next appointment:   In Person  Provider:   You will follow up in the [facility] located at [facility]. Your provider will  be: Roderic Palau, NP or Clint R. Fenton, PA-C{  Your physician recommends that you schedule a follow-up appointment in: 3 months with Dr. Johnsie Cancel.  Important Information About Sugar

## 2021-10-17 ENCOUNTER — Encounter: Payer: Self-pay | Admitting: Cardiovascular Disease

## 2021-10-17 ENCOUNTER — Ambulatory Visit: Payer: Medicare Other | Attending: Internal Medicine

## 2021-10-17 DIAGNOSIS — I10 Essential (primary) hypertension: Secondary | ICD-10-CM | POA: Diagnosis not present

## 2021-10-17 DIAGNOSIS — I480 Paroxysmal atrial fibrillation: Secondary | ICD-10-CM | POA: Diagnosis not present

## 2021-10-17 DIAGNOSIS — E782 Mixed hyperlipidemia: Secondary | ICD-10-CM | POA: Diagnosis not present

## 2021-10-17 MED ORDER — METOPROLOL TARTRATE 25 MG PO TABS: 12.5000 mg | ORAL_TABLET | Freq: Two times a day (BID) | ORAL | 3 refills | Status: DC

## 2021-10-18 LAB — EXERCISE TOLERANCE TEST
Estimated workload: 5.8
Exercise duration (min): 4 min
Exercise duration (sec): 1 s
MPHR: 152 {beats}/min
Peak HR: 113 {beats}/min
Percent HR: 74 %
Rest HR: 41 {beats}/min
ST Depression (mm): 0 mm

## 2021-11-01 ENCOUNTER — Encounter (HOSPITAL_COMMUNITY): Payer: Self-pay | Admitting: Nurse Practitioner

## 2021-11-01 ENCOUNTER — Ambulatory Visit (HOSPITAL_COMMUNITY)
Admission: RE | Admit: 2021-11-01 | Discharge: 2021-11-01 | Disposition: A | Discharge status: Home / Self Care | Payer: Medicare Other | Source: Ambulatory Visit | Attending: Physician Assistant | Admitting: Physician Assistant

## 2021-11-01 VITALS — BP 168/76 | HR 56 | Ht 67.0 in | Wt 269.2 lb

## 2021-11-01 DIAGNOSIS — D6869 Other thrombophilia: Secondary | ICD-10-CM | POA: Diagnosis not present

## 2021-11-01 DIAGNOSIS — I4819 Other persistent atrial fibrillation: Secondary | ICD-10-CM | POA: Diagnosis not present

## 2021-11-01 DIAGNOSIS — I10 Essential (primary) hypertension: Secondary | ICD-10-CM | POA: Diagnosis not present

## 2021-11-01 DIAGNOSIS — Z8639 Personal history of other endocrine, nutritional and metabolic disease: Secondary | ICD-10-CM | POA: Insufficient documentation

## 2021-11-01 DIAGNOSIS — R001 Bradycardia, unspecified: Secondary | ICD-10-CM | POA: Diagnosis not present

## 2021-11-01 DIAGNOSIS — Z8249 Family history of ischemic heart disease and other diseases of the circulatory system: Secondary | ICD-10-CM | POA: Diagnosis not present

## 2021-11-01 DIAGNOSIS — I480 Paroxysmal atrial fibrillation: Secondary | ICD-10-CM | POA: Diagnosis not present

## 2021-11-01 DIAGNOSIS — Z7901 Long term (current) use of anticoagulants: Secondary | ICD-10-CM | POA: Diagnosis not present

## 2021-11-01 NOTE — Progress Notes (Addendum)
Primary Care Physician: Sharilyn Sites, MD Referring Physician: Dr. Orlene Och is a 66 y.o. female with a h/o DM, anxiety, HTN, with new onset afib seen by PCP 03/24/21 and was started on coumadin and atenolol. She was referred to Dr. Johnsie Cancel and she was switched to eliquis with a CHA2DS2VASc  score of 3, and Cardizem 240 mg daily was started with a BB. She had a successful cardioversion 3/21, with sinus brady in the low 40's, and is now in a junctional bradycardia at 39 bpm. She is not symptomatic with this. Her weight is noted to be up 10 lbs in just a few weeks. She has LLE and feels mildly bloated. Does not check weight at home. She states that she sleeps poorly, but denies snoring/apnea. Minimal caffeine, no alcohol/tobacco.   F/u in afib clinic, 11/01/21. She saw Dr. Cecile Sheerer in August and was in afib, she was unaware. She was started on flecainide and had f/u ETT. Her hr's remain  in the 40's but feels well with this. In Sinus brady today. She has gained weight has not been walking over the hot summer, but plans with the upcoming fall weather to start walking outside again soon.   Today, she denies symptoms of palpitations, chest pain, shortness of breath, orthopnea, PND, lower extremity edema, dizziness, presyncope, syncope, or neurologic sequela. The patient is tolerating medications without difficulties and is otherwise without complaint today.   Past Medical History:  Diagnosis Date   Anemia    Anxiety    Arthritis    Diabetes (HCC)    GERD (gastroesophageal reflux disease)    Hyperlipidemia    Hypertension    Obesity    Palpitations    Uterine fibroid    Past Surgical History:  Procedure Laterality Date   ABDOMINAL HYSTERECTOMY  1974   CARDIOVERSION N/A 05/09/2021   Procedure: CARDIOVERSION;  Surgeon: Sanda Klein, MD;  Location: [facility];  Service: Cardiovascular;  Laterality: N/A;   COLONOSCOPY     TOTAL KNEE ARTHROPLASTY Left 0175   UMBILICAL  HERNIA REPAIR  2000    Current Outpatient Medications  Medication Sig Dispense Refill   ALPRAZolam (XANAX) 0.5 MG tablet Take 0.5 mg by mouth at bedtime as needed for anxiety.     apixaban (ELIQUIS) 5 MG TABS tablet Take 1 tablet (5 mg total) by mouth 2 (two) times daily. 60 tablet 11   Carboxymethylcellul-Glycerin (LUBRICATING EYE DROPS OP) Place 1 drop into both eyes daily as needed (dry eyes).     Cholecalciferol (VITAMIN D3) 2000 UNITS TABS Take 2,000 Units by mouth daily.     diltiazem (CARDIZEM CD) 240 MG 24 hr capsule Take 1 capsule (240 mg total) by mouth daily. 90 capsule 2   escitalopram (LEXAPRO) 10 MG tablet Take 10 mg by mouth daily.     flecainide (TAMBOCOR) 50 MG tablet Take 1.5 tablets (75 mg total) by mouth 2 (two) times daily. 270 tablet 3   furosemide (LASIX) 20 MG tablet TAKE 1 TABLET BY MOUTH DAILY AS NEEDED (SWELLING/WEIGHT GAIN). 30 tablet 1   KLOR-CON M20 20 MEQ tablet TAKE 1 TABLET (20 MEQ TOTAL) BY MOUTH DAILY AS NEEDED (WHEN LASIX IS TAKEN). 30 tablet 1   lisinopril-hydrochlorothiazide (ZESTORETIC) 20-25 MG tablet Take 1 tablet by mouth daily.     metoprolol tartrate (LOPRESSOR) 25 MG tablet Take 0.5 tablets (12.5 mg total) by mouth 2 (two) times daily. 90 tablet 3   mirtazapine (REMERON) 15 MG tablet Take  15 mg by mouth at bedtime.     Multiple Vitamins-Minerals (CENTRUM SILVER PO) Take 1 tablet by mouth daily.     Omega-3 Fatty Acids (FISH OIL OMEGA-3 PO) Take 1 tablet by mouth every morning.     omeprazole (PRILOSEC OTC) 20 MG tablet Take 20 mg by mouth daily.     pravastatin (PRAVACHOL) 20 MG tablet Take 20 mg by mouth daily.     pyridOXINE (VITAMIN B-6) 100 MG tablet Take 100 mg by mouth daily.     terbinafine (LAMISIL) 250 MG tablet Take 250 mg by mouth daily.     vitamin B-12 (CYANOCOBALAMIN) 500 MCG tablet Take 500 mcg by mouth daily.     No current facility-administered medications for this encounter.    No Known Allergies  Social History    Socioeconomic History   Marital status: Married    Spouse name: Not on file   Number of children: 1   Years of education: Not on file   Highest education level: Not on file  Occupational History   Occupation: retired Quarry manager  Tobacco Use   Smoking status: Never   Smokeless tobacco: Never  Vaping Use   Vaping Use: Never used  Substance and Sexual Activity   Alcohol use: No   Drug use: No   Sexual activity: Not on file  Other Topics Concern   Not on file  Social History Narrative   Not on file   Social Determinants of Health   Financial Resource Strain: Not on file  Food Insecurity: Not on file  Transportation Needs: Not on file  Physical Activity: Not on file  Stress: Not on file  Social Connections: Not on file  Intimate Partner Violence: Not on file    Family History  Problem Relation Age of Onset   Heart disease Father    Alzheimer's disease Mother    Colon polyps Sister    Breast cancer Sister    Emphysema Sister        smoker   Emphysema Brother        smoker   Cancer Maternal Grandmother        type unknown    ROS- All systems are reviewed and negative except as per the HPI above  Physical Exam: Vitals:   11/01/21 1028  Height: '5\' 7"'$  (1.702 m)   Wt Readings from Last 3 Encounters:  10/03/21 122.4 kg  05/30/21 118.7 kg  05/24/21 119.4 kg    Labs: Lab Results  Component Value Date   NA 138 05/03/2021   K 4.1 05/03/2021   CL 101 05/03/2021   CO2 32 (H) 05/03/2021   GLUCOSE 108 (H) 05/03/2021   BUN 13 05/03/2021   CREATININE 0.80 05/03/2021   CALCIUM 10.3 05/03/2021   No results found for: "INR" No results found for: "CHOL", "HDL", "[place]", "TRIG"   GEN- The patient is well appearing, alert and oriented x 3 today.   Head- normocephalic, atraumatic Eyes-  Sclera clear, conjunctiva pink Ears- hearing intact Oropharynx- clear Neck- supple, no JVP Lymph- no cervical lymphadenopathy Lungs- Clear to ausculation bilaterally, normal work  of breathing Heart- Regular rate and rhythm, no murmurs, rubs or gallops, PMI not laterally displaced GI- soft, NT, ND, + BS Extremities- no clubbing, cyanosis, or  1+ edema MS- no significant deformity or atrophy Skin- no rash or lesion Psych- euthymic mood, full affect Neuro- strength and sensation are intact  EKG-Vent. rate 46 BPM PR interval 168 ms QRS duration 80 ms QT/QTcB 494/432  ms P-R-T axes 31 -5 19 Sinus bradycardia Nonspecific T wave abnormality Abnormal ECG When compared with ECG of 30-May-2021 10:40, PREVIOUS ECG IS PRESENT  Zio patch 06/12/21-Patch Wear Time:  13 days and 19 hours    Predominant rhythm was sinus rhythm Multiple SVT runs, longest 20 seconds Less than 1% ventricular and supraventricular ectopy No triggered episodes noted   Will Camnitz, MD  Epic records revealed  Assessment and Plan:  1. New onset  afib in winter of 2023 Successful cardioversion 04/2021 with ERAF in August and started on flecainide 75 mg bid  She is in SR today and has not noted any irregular HR since starting flecainide  Continue ccb/bb at current doses  2. HTN Elevated on presentation  At home in the 583'E systolic  Weight loss/exercise recommended   3. CHA2DS2VASc  score of 3 Continue eliquis 5 mg bid    4. Bradycardia Asymptomatic   F/u with Dr. Johnsie Cancel in November as scheduled Afib clinic as needed   Geroge Baseman. Emmalene Kattner, [facility] [address] [place], [place] [address] [phone]

## 2021-11-07 DIAGNOSIS — Z23 Encounter for immunization: Secondary | ICD-10-CM | POA: Diagnosis not present

## 2021-11-07 DIAGNOSIS — M1711 Unilateral primary osteoarthritis, right knee: Secondary | ICD-10-CM | POA: Diagnosis not present

## 2021-12-01 ENCOUNTER — Other Ambulatory Visit: Payer: Self-pay | Admitting: Cardiovascular Disease

## 2021-12-06 DIAGNOSIS — H26493 Other secondary cataract, bilateral: Secondary | ICD-10-CM | POA: Diagnosis not present

## 2021-12-11 DIAGNOSIS — M159 Polyosteoarthritis, unspecified: Secondary | ICD-10-CM | POA: Diagnosis not present

## 2021-12-11 DIAGNOSIS — I4891 Unspecified atrial fibrillation: Secondary | ICD-10-CM | POA: Diagnosis not present

## 2021-12-11 DIAGNOSIS — I10 Essential (primary) hypertension: Secondary | ICD-10-CM | POA: Diagnosis not present

## 2021-12-11 DIAGNOSIS — F5101 Primary insomnia: Secondary | ICD-10-CM | POA: Diagnosis not present

## 2021-12-19 ENCOUNTER — Other Ambulatory Visit: Payer: Self-pay | Admitting: *Deleted

## 2021-12-19 MED ORDER — FLECAINIDE ACETATE 50 MG PO TABS: 75.0000 mg | ORAL_TABLET | Freq: Two times a day (BID) | ORAL | 3 refills | Status: DC

## 2021-12-21 NOTE — Progress Notes (Signed)
CARDIOLOGY CONSULT NOTE       Patient ID: Emily Andersen MRN: [id number] DOB/AGE: Apr 07, 1955 66 y.o.  Admit date: (Not on file) Referring Physician: Gerarda Fraction Primary Physician: Sharilyn Sites, MD Primary Cardiologist: Johnsie Cancel   HPI:  66 y.o. referred by Dr Gerarda Fraction for PAF First seen on 04/18/21 History of DM, Anxiety, HTN and palpitations Seen in office by primary 03/24/21 thought to be in new onset afib. Started on coumadin and atenolol Poor quality tracing from their office shows fib/flutter rate 86 bpm nonspecific ST change Started on lopressor and eliquis [facility] on 05/09/21 with Dr Sallyanne Kuster Had frequent PACls and post conversion SB rates 40's Monitor with some junctional rhythm lopressor dose decreased TTE 05/03/21 with EF 50-55% no valve dx LA 51 mm   Still on cardizem 240 mg daily and Lopressor 12.5 mg bid   Office visit 10/03/21  ERAF rate 112 She does not realized it but given post [facility] PACls suspected started on flecainide ETT no QRS widening or arrhythmia but only went 74% predicted maximal HR 10/17/21 Converted to NSR and was in NSR when seen in afib clinic 11/01/21  ECG sinus bradycardia rate 46 no AV block  Still on lopressor 12.5 mg bid and cardizem 240 mg daily for AV nodal drugs Compliant with eliquis   Discussed SSS and stopping lopressor given HR in 40's She will take once/day for 2 weeks then stop    ROS All other systems reviewed and negative except as noted above  Past Medical History:  Diagnosis Date   Anemia    Anxiety    Arthritis    Diabetes (HCC)    GERD (gastroesophageal reflux disease)    Hyperlipidemia    Hypertension    Obesity    Palpitations    Uterine fibroid     Family History  Problem Relation Age of Onset   Heart disease Father    Alzheimer's disease Mother    Colon polyps Sister    Breast cancer Sister    Emphysema Sister        smoker   Emphysema Brother        smoker   Cancer Maternal Grandmother        type unknown    Social History    Socioeconomic History   Marital status: Married    Spouse name: Not on file   Number of children: 1   Years of education: Not on file   Highest education level: Not on file  Occupational History   Occupation: retired Quarry manager  Tobacco Use   Smoking status: Never   Smokeless tobacco: Never  Vaping Use   Vaping Use: Never used  Substance and Sexual Activity   Alcohol use: No   Drug use: No   Sexual activity: Not on file  Other Topics Concern   Not on file  Social History Narrative   Not on file   Social Determinants of Health   Financial Resource Strain: Not on file  Food Insecurity: Not on file  Transportation Needs: Not on file  Physical Activity: Not on file  Stress: Not on file  Social Connections: Not on file  Intimate Partner Violence: Not on file    Past Surgical History:  Procedure Laterality Date   ABDOMINAL HYSTERECTOMY  1974   CARDIOVERSION N/A 05/09/2021   Procedure: CARDIOVERSION;  Surgeon: Sanda Klein, MD;  Location: [facility];  Service: Cardiovascular;  Laterality: N/A;   COLONOSCOPY     TOTAL KNEE ARTHROPLASTY Left 2006  UMBILICAL HERNIA REPAIR  2000      Current Outpatient Medications:    ALPRAZolam (XANAX) 0.5 MG tablet, Take 0.5 mg by mouth at bedtime as needed for anxiety., Disp: , Rfl:    apixaban (ELIQUIS) 5 MG TABS tablet, Take 1 tablet (5 mg total) by mouth 2 (two) times daily., Disp: 60 tablet, Rfl: 11   Carboxymethylcellul-Glycerin (LUBRICATING EYE DROPS OP), Place 1 drop into both eyes daily as needed (dry eyes)., Disp: , Rfl:    Cholecalciferol (VITAMIN D3) 2000 UNITS TABS, Take 2,000 Units by mouth daily., Disp: , Rfl:    diltiazem (CARDIZEM CD) 240 MG 24 hr capsule, TAKE 1 CAPSULE BY MOUTH DAILY, Disp: 90 capsule, Rfl: 3   flecainide (TAMBOCOR) 50 MG tablet, Take 1.5 tablets (75 mg total) by mouth 2 (two) times daily., Disp: 270 tablet, Rfl: 3   furosemide (LASIX) 20 MG tablet, TAKE 1 TABLET BY MOUTH DAILY AS NEEDED (SWELLING/WEIGHT  GAIN)., Disp: 30 tablet, Rfl: 1   KLOR-CON M20 20 MEQ tablet, TAKE 1 TABLET (20 MEQ TOTAL) BY MOUTH DAILY AS NEEDED (WHEN LASIX IS TAKEN)., Disp: 30 tablet, Rfl: 1   lisinopril-hydrochlorothiazide (ZESTORETIC) 20-25 MG tablet, Take 1 tablet by mouth daily., Disp: , Rfl:    metoprolol tartrate (LOPRESSOR) 25 MG tablet, Take 0.5 tablets (12.5 mg total) by mouth 2 (two) times daily., Disp: 90 tablet, Rfl: 3   Multiple Vitamins-Minerals (CENTRUM SILVER PO), Take 1 tablet by mouth daily., Disp: , Rfl:    Omega-3 Fatty Acids (FISH OIL OMEGA-3 PO), Take 1 tablet by mouth every morning., Disp: , Rfl:    omeprazole (PRILOSEC OTC) 20 MG tablet, Take 20 mg by mouth daily., Disp: , Rfl:    pravastatin (PRAVACHOL) 20 MG tablet, Take 20 mg by mouth daily., Disp: , Rfl:    pyridOXINE (VITAMIN B-6) 100 MG tablet, Take 100 mg by mouth daily., Disp: , Rfl:    vitamin B-12 (CYANOCOBALAMIN) 500 MCG tablet, Take 500 mcg by mouth daily., Disp: , Rfl:    escitalopram (LEXAPRO) 10 MG tablet, Take 10 mg by mouth daily., Disp: , Rfl:    mirtazapine (REMERON) 15 MG tablet, Take 15 mg by mouth at bedtime., Disp: , Rfl:    terbinafine (LAMISIL) 250 MG tablet, Take 250 mg by mouth daily., Disp: , Rfl:     Physical Exam: Blood pressure 136/88, pulse 74, height '5\' 7"'$  (1.702 m), weight 265 lb 12.8 oz (120.6 kg), SpO2 96 %.    Affect appropriate Overweight black female HEENT: normal Neck supple with no adenopathy JVP normal no bruits no thyromegaly Lungs clear with no wheezing and good diaphragmatic motion Heart:  S1/S2 no murmur, no rub, gallop or click PMI normal Abdomen: benighn, BS positve, no tenderness, no AAA no bruit.  No HSM or HJR Distal pulses intact with no bruits No edema Neuro non-focal Skin warm and dry No muscular weakness   Labs:   Lab Results  Component Value Date   WBC 7.5 05/03/2021   HGB 13.6 05/03/2021   HCT 40.5 05/03/2021   MCV 78 (L) 05/03/2021   PLT 276 05/03/2021   No results  for input(s): "NA", "K", "CL", "CO2", "BUN", "CREATININE", "CALCIUM", "PROT", "BILITOT", "ALKPHOS", "ALT", "AST", "GLUCOSE" in the last 168 hours.  Invalid input(s): "LABALBU" No results found for: "CKTOTAL", "CKMB", "CKMBINDEX", "TROPONINI" No results found for: "CHOL" No results found for: "HDL" No results found for: "LDLCALC" No results found for: "TRIG" No results found for: "CHOLHDL" No results found for: "  LDLDIRECT"    Radiology: No results found.  EKG: afib rate 112 nonspecific ST changes QT 340 msec    ASSESSMENT AND PLAN:   New onset afib:  03/24/21  Post [place] on 05/09/21 with bradycardia /junctional escape Lopressor dose decreased Moderate LAE on TTE   CHADVASC 3  Recurrent PAF on ECG August  Converted with flecainide Bradycardia persists will d/c lopressor and see in 4 weeks May need PPM in future for SSS HTN:  Well controlled.  Continue current medications and low sodium Dash type diet.   HLD:  continue statin  Anxiety/Depression:  continue Lexapro   D/C lopressor over 2 weeks   F/U in 4-6  weeks [place] or [place]  Signed: Jenkins Rouge 12/29/2021, 10:58 AM

## 2021-12-29 ENCOUNTER — Encounter: Payer: Self-pay | Admitting: Cardiovascular Disease

## 2021-12-29 ENCOUNTER — Ambulatory Visit: Payer: Medicare Other | Attending: Cardiovascular Disease | Admitting: Cardiovascular Disease

## 2021-12-29 VITALS — BP 136/88 | HR 74 | Ht 67.0 in | Wt 265.8 lb

## 2021-12-29 DIAGNOSIS — I480 Paroxysmal atrial fibrillation: Secondary | ICD-10-CM | POA: Diagnosis not present

## 2021-12-29 DIAGNOSIS — R001 Bradycardia, unspecified: Secondary | ICD-10-CM | POA: Diagnosis not present

## 2021-12-29 DIAGNOSIS — I10 Essential (primary) hypertension: Secondary | ICD-10-CM

## 2021-12-29 DIAGNOSIS — E782 Mixed hyperlipidemia: Secondary | ICD-10-CM | POA: Diagnosis not present

## 2021-12-29 NOTE — Patient Instructions (Addendum)
Medication Instructions:  Your physician has recommended you make the following change in your medication:  1-STOP metoprolol after tapering off for one week.  *If you need a refill on your cardiac medications before your next appointment, please call your pharmacy*  Lab Work: If you have labs (blood work) drawn today and your tests are completely normal, you will receive your results only by: [place] (if you have MyChart) OR A paper copy in the mail If you have any lab test that is abnormal or we need to change your treatment, we will call you to review the results.  Follow-Up: At [facility], you and your health needs are our priority.  As part of our continuing mission to provide you with exceptional heart care, we have created designated Provider Care Teams.  These Care Teams include your primary Cardiologist (physician) and Advanced Practice Providers (APPs -  Physician Assistants and Nurse Practitioners) who all work together to provide you with the care you need, when you need it.  We recommend signing up for the patient portal called "MyChart".  Sign up information is provided on this After Visit Summary.  MyChart is used to connect with patients for Virtual Visits (Telemedicine).  Patients are able to view lab/test results, encounter notes, upcoming appointments, etc.  Non-urgent messages can be sent to your provider as well.   To learn more about what you can do with MyChart, go to [email or web address].    Your next appointment:   3 to 4  week(s)  The format for your next appointment:   In Person  Provider:   Jenkins Rouge, MD     Important Information About Sugar

## 2022-01-09 ENCOUNTER — Telehealth: Payer: Self-pay

## 2022-01-09 DIAGNOSIS — I10 Essential (primary) hypertension: Secondary | ICD-10-CM | POA: Diagnosis not present

## 2022-01-09 DIAGNOSIS — E119 Type 2 diabetes mellitus without complications: Secondary | ICD-10-CM | POA: Diagnosis not present

## 2022-01-09 DIAGNOSIS — M159 Polyosteoarthritis, unspecified: Secondary | ICD-10-CM | POA: Diagnosis not present

## 2022-01-09 DIAGNOSIS — M791 Myalgia, unspecified site: Secondary | ICD-10-CM | POA: Diagnosis not present

## 2022-01-09 NOTE — Telephone Encounter (Signed)
Printed application information.  Given to Dr Johnsie Cancel to be signed.

## 2022-01-09 NOTE — Telephone Encounter (Signed)
Paperwork has been signed by MD and faxed to number on cover sheet as requested.

## 2022-01-09 NOTE — Telephone Encounter (Signed)
**Note De-Identified [name] Obfuscation** The pts completed BMSPAF application for Eliquis assistance was left at the ofice with documents.  I have competed the providers page of her application and have e-mailed all to the nurse working with Dr Johnsie Cancel today so she can obtain his signature, date it, and to then fax all to [facility] at the fax number written on the cover letter included.

## 2022-01-16 NOTE — Progress Notes (Signed)
CARDIOLOGY CONSULT NOTE       Patient ID: Emily Andersen MRN: [id number] DOB/AGE: 68/68/1957 68 y.o.  Admit date: (Not on file) Referring Physician: Gerarda Fraction Primary Physician: Sharilyn Sites, MD Primary Cardiologist: Johnsie Cancel   HPI:  66 y.o. referred by Dr Gerarda Fraction for PAF First seen on 04/18/21 History of DM, Anxiety, HTN and palpitations Seen in office by primary 03/24/21 thought to be in new onset afib. Started on coumadin and atenolol Poor quality tracing from their office shows fib/flutter rate 86 bpm nonspecific ST change Started on lopressor and eliquis [facility] on 05/09/21 with Dr Sallyanne Kuster Had frequent PACls and post conversion SB rates 40's Monitor with some junctional rhythm lopressor dose decreased TTE 05/03/21 with EF 50-55% no valve dx LA 51 mm   Still on cardizem 240 mg daily and Lopressor 12.5 mg bid   Office visit 10/03/21  ERAF rate 112 She does not realized it but given post [facility] PACls suspected started on flecainide ETT no QRS widening or arrhythmia but only went 74% predicted maximal HR 10/17/21 Converted to NSR and was in NSR when seen in afib clinic 11/01/21  ECG sinus bradycardia rate 46 no AV block   12/29/21 Still on lopressor 12.5 mg bid and cardizem 240 mg daily for AV nodal drugs Compliant with eliquis   12/29/21 Discussed SSS and stopping lopressor given HR in 40's She will take once/day for 2 weeks then stop   She is much better now with HR mid to upper 50's No recurrence of PAF  Primary placed her on norvasc 5 mg for BP control    ROS All other systems reviewed and negative except as noted above  Past Medical History:  Diagnosis Date   Anemia    Anxiety    Arthritis    Diabetes (HCC)    GERD (gastroesophageal reflux disease)    Hyperlipidemia    Hypertension    Obesity    Palpitations    Uterine fibroid     Family History  Problem Relation Age of Onset   Heart disease Father    Alzheimer's disease Mother    Colon polyps Sister    Breast cancer Sister     Emphysema Sister        smoker   Emphysema Brother        smoker   Cancer Maternal Grandmother        type unknown    Social History   Socioeconomic History   Marital status: Married    Spouse name: Not on file   Number of children: 1   Years of education: Not on file   Highest education level: Not on file  Occupational History   Occupation: retired Quarry manager  Tobacco Use   Smoking status: Never   Smokeless tobacco: Never  Vaping Use   Vaping Use: Never used  Substance and Sexual Activity   Alcohol use: No   Drug use: No   Sexual activity: Not on file  Other Topics Concern   Not on file  Social History Narrative   Not on file   Social Determinants of Health   Financial Resource Strain: Not on file  Food Insecurity: Not on file  Transportation Needs: Not on file  Physical Activity: Not on file  Stress: Not on file  Social Connections: Not on file  Intimate Partner Violence: Not on file    Past Surgical History:  Procedure Laterality Date   [place] N/A 05/09/2021  Procedure: CARDIOVERSION;  Surgeon: Sanda Klein, MD;  Location: [facility];  Service: Cardiovascular;  Laterality: N/A;   COLONOSCOPY     TOTAL KNEE ARTHROPLASTY Left 1324   UMBILICAL HERNIA REPAIR  2000      Current Outpatient Medications:    ALPRAZolam (XANAX) 0.5 MG tablet, Take 0.5 mg by mouth at bedtime as needed for anxiety., Disp: , Rfl:    amLODipine (NORVASC) 5 MG tablet, Take 5 mg by mouth daily., Disp: , Rfl:    apixaban (ELIQUIS) 5 MG TABS tablet, Take 1 tablet (5 mg total) by mouth 2 (two) times daily., Disp: 60 tablet, Rfl: 11   Carboxymethylcellul-Glycerin (LUBRICATING EYE DROPS OP), Place 1 drop into both eyes daily as needed (dry eyes)., Disp: , Rfl:    Cholecalciferol (VITAMIN D3) 2000 UNITS TABS, Take 2,000 Units by mouth daily., Disp: , Rfl:    diltiazem (CARDIZEM CD) 240 MG 24 hr capsule, TAKE 1 CAPSULE BY MOUTH DAILY, Disp: 90 capsule, Rfl:  3   flecainide (TAMBOCOR) 50 MG tablet, Take 1.5 tablets (75 mg total) by mouth 2 (two) times daily., Disp: 270 tablet, Rfl: 3   lisinopril-hydrochlorothiazide (ZESTORETIC) 20-25 MG tablet, Take 1 tablet by mouth daily., Disp: , Rfl:    Multiple Vitamins-Minerals (CENTRUM SILVER PO), Take 1 tablet by mouth daily., Disp: , Rfl:    Omega-3 Fatty Acids (FISH OIL OMEGA-3 PO), Take 1 tablet by mouth every morning., Disp: , Rfl:    omeprazole (PRILOSEC OTC) 20 MG tablet, Take 20 mg by mouth daily., Disp: , Rfl:    pravastatin (PRAVACHOL) 20 MG tablet, Take 20 mg by mouth daily., Disp: , Rfl:    pyridOXINE (VITAMIN B-6) 100 MG tablet, Take 100 mg by mouth daily., Disp: , Rfl:    traZODone (DESYREL) 50 MG tablet, Take 50 mg by mouth at bedtime., Disp: , Rfl:    vitamin B-12 (CYANOCOBALAMIN) 500 MCG tablet, Take 500 mcg by mouth daily., Disp: , Rfl:    escitalopram (LEXAPRO) 10 MG tablet, Take 10 mg by mouth daily., Disp: , Rfl:    furosemide (LASIX) 20 MG tablet, TAKE 1 TABLET BY MOUTH DAILY AS NEEDED (SWELLING/WEIGHT GAIN)., Disp: 30 tablet, Rfl: 1   KLOR-CON M20 20 MEQ tablet, TAKE 1 TABLET (20 MEQ TOTAL) BY MOUTH DAILY AS NEEDED (WHEN LASIX IS TAKEN)., Disp: 30 tablet, Rfl: 1   mirtazapine (REMERON) 15 MG tablet, Take 15 mg by mouth at bedtime., Disp: , Rfl:    terbinafine (LAMISIL) 250 MG tablet, Take 250 mg by mouth daily., Disp: , Rfl:     Physical Exam: Blood pressure (!) 148/82, pulse (!) 59, height '5\' 7"'$  (1.702 m), weight 264 lb (119.7 kg), SpO2 97 %.    Affect appropriate Overweight black female HEENT: normal Neck supple with no adenopathy JVP normal no bruits no thyromegaly Lungs clear with no wheezing and good diaphragmatic motion Heart:  S1/S2 no murmur, no rub, gallop or click PMI normal Abdomen: benighn, BS positve, no tenderness, no AAA no bruit.  No HSM or HJR Distal pulses intact with no bruits No edema Neuro non-focal Skin warm and dry No muscular weakness   Labs:    Lab Results  Component Value Date   WBC 7.5 05/03/2021   HGB 13.6 05/03/2021   HCT 40.5 05/03/2021   MCV 78 (L) 05/03/2021   PLT 276 05/03/2021   No results for input(s): "NA", "K", "CL", "CO2", "BUN", "CREATININE", "CALCIUM", "PROT", "BILITOT", "ALKPHOS", "ALT", "AST", "GLUCOSE" in the last 168 hours.  Invalid input(s): "LABALBU" No results found for: "CKTOTAL", "CKMB", "CKMBINDEX", "TROPONINI" No results found for: "CHOL" No results found for: "HDL" No results found for: "LDLCALC" No results found for: "TRIG" No results found for: "CHOLHDL" No results found for: "LDLDIRECT"    Radiology: No results found.  EKG: afib rate 112 nonspecific ST changes QT 340 msec    ASSESSMENT AND PLAN:   New onset afib:  03/24/21  Post [place] on 05/09/21 with bradycardia /junctional escape Lopressor dose decreased Moderate LAE on TTE   CHADVASC 3  Recurrent PAF on ECG August  Converted with flecainide Beta blocker d/c 12/29/21 improved  HTN:  Better on norvasc f/u primary  HLD:  continue statin  Anxiety/Depression:  continue Lexapro    F/U 6 months   Signed: Jenkins Rouge 01/30/2022, 9:56 AM

## 2022-01-19 NOTE — Telephone Encounter (Signed)
**Note De-Identified Emily Andersen Obfuscation** I reviewed the pts entire application that was left at the office and faxed to [facility] on 11/21 and the providers part of her application is all there.  The pt provided her Med/allergy list, Ins card, and proof of income with her application but not her out of pocket RX expense report.  I called the pt but got not answer so I left a detailed message on her VM advising her to call [employer] at [phone] to ask what information they are missing and to call Jeani Hawking back at Dr Kyla Balzarine office if she has any questions/concerns.

## 2022-01-19 NOTE — Telephone Encounter (Signed)
Pt states that the assistance program is requesting more information. Requesting call back to discuss.

## 2022-01-30 ENCOUNTER — Encounter: Payer: Self-pay | Admitting: Cardiovascular Disease

## 2022-01-30 ENCOUNTER — Ambulatory Visit: Payer: Medicare Other | Attending: Cardiovascular Disease | Admitting: Cardiovascular Disease

## 2022-01-30 VITALS — BP 148/82 | HR 59 | Ht 67.0 in | Wt 264.0 lb

## 2022-01-30 DIAGNOSIS — I480 Paroxysmal atrial fibrillation: Secondary | ICD-10-CM

## 2022-01-30 DIAGNOSIS — I10 Essential (primary) hypertension: Secondary | ICD-10-CM | POA: Diagnosis not present

## 2022-01-30 DIAGNOSIS — R001 Bradycardia, unspecified: Secondary | ICD-10-CM

## 2022-01-30 NOTE — Patient Instructions (Signed)

## 2022-02-02 DIAGNOSIS — Z1231 Encounter for screening mammogram for malignant neoplasm of breast: Secondary | ICD-10-CM | POA: Diagnosis not present

## 2022-02-06 ENCOUNTER — Encounter (HOSPITAL_COMMUNITY): Payer: Self-pay | Admitting: Internal Medicine

## 2022-02-28 DIAGNOSIS — H26493 Other secondary cataract, bilateral: Secondary | ICD-10-CM | POA: Diagnosis not present

## 2022-03-16 NOTE — Telephone Encounter (Signed)
**Note De-Identified Emily Andersen Obfuscation** Letter received from [facility] stating that they need to pts entire household's proof of income to complete her application. The letter states that they have notified the pt of this need.

## 2022-03-20 NOTE — Telephone Encounter (Signed)
**Note De-Identified Deonne Rooks Obfuscation** Pt left her POI at the office with a request for [place] to fax to [employer]. I have faxed all to [employer] successfully-confirmation below:   Fax: Tx 'ok' Report CONE_EMAIL-to-Fax Editha Bridgeforth, Mardene Celeste This message was sent Georgenia Salim [facility], a product from [employer] [email or web address].com/                    -------Fax Transmission Report-------  To:               Recipient at [phone] Subject:          Fw: Scan from [place] Result:           The transmission was successful. Explanation:      All Pages Ok Pages Sent:       6 Connect Time:     5 minutes, 29 seconds Transmit Time:    03/20/2022 11:14 Transfer Rate:    14400 Status Code:      0000 Retry Count:      0 Job Id:           [id number] Unique Id:        [id number] Fax Line:         30 Fax Server:       [employer]

## 2022-03-29 ENCOUNTER — Other Ambulatory Visit: Payer: Self-pay

## 2022-03-29 ENCOUNTER — Encounter (HOSPITAL_COMMUNITY): Payer: Self-pay | Admitting: *Deleted

## 2022-03-29 MED ORDER — DILTIAZEM HCL ER COATED BEADS 240 MG PO CP24: 240.0000 mg | ORAL_CAPSULE | Freq: Every day | ORAL | 3 refills | Status: DC

## 2022-04-20 ENCOUNTER — Other Ambulatory Visit: Payer: Self-pay | Admitting: *Deleted

## 2022-04-20 MED ORDER — APIXABAN 5 MG PO TABS: 5.0000 mg | ORAL_TABLET | Freq: Two times a day (BID) | ORAL | 0 refills | Status: DC

## 2022-04-20 NOTE — Telephone Encounter (Signed)
Prescription refill request for Eliquis received. Indication: afib  Last office visit: Nishan, 01/30/2022 Scr: 0.80, 05/03/2021 Age: 67  Weight: 119.7 kg     Refill sent. Pt sees Nishan in June. Put on appointment note that pt will need blood for Eliquis follow up.

## 2022-05-02 ENCOUNTER — Other Ambulatory Visit: Payer: Self-pay | Admitting: Cardiovascular Disease

## 2022-05-23 ENCOUNTER — Other Ambulatory Visit: Payer: Self-pay | Admitting: Oral Surgery

## 2022-05-23 DIAGNOSIS — D101 Benign neoplasm of tongue: Secondary | ICD-10-CM | POA: Diagnosis not present

## 2022-06-12 DIAGNOSIS — G9332 Myalgic encephalomyelitis/chronic fatigue syndrome: Secondary | ICD-10-CM | POA: Diagnosis not present

## 2022-06-12 DIAGNOSIS — Z0001 Encounter for general adult medical examination with abnormal findings: Secondary | ICD-10-CM | POA: Diagnosis not present

## 2022-06-12 DIAGNOSIS — D518 Other vitamin B12 deficiency anemias: Secondary | ICD-10-CM | POA: Diagnosis not present

## 2022-06-12 DIAGNOSIS — E782 Mixed hyperlipidemia: Secondary | ICD-10-CM | POA: Diagnosis not present

## 2022-06-12 DIAGNOSIS — M159 Polyosteoarthritis, unspecified: Secondary | ICD-10-CM | POA: Diagnosis not present

## 2022-06-12 DIAGNOSIS — E559 Vitamin D deficiency, unspecified: Secondary | ICD-10-CM | POA: Diagnosis not present

## 2022-06-12 DIAGNOSIS — I4891 Unspecified atrial fibrillation: Secondary | ICD-10-CM | POA: Diagnosis not present

## 2022-06-12 DIAGNOSIS — F5101 Primary insomnia: Secondary | ICD-10-CM | POA: Diagnosis not present

## 2022-06-12 DIAGNOSIS — E7849 Other hyperlipidemia: Secondary | ICD-10-CM | POA: Diagnosis not present

## 2022-06-12 DIAGNOSIS — I10 Essential (primary) hypertension: Secondary | ICD-10-CM | POA: Diagnosis not present

## 2022-06-12 DIAGNOSIS — N390 Urinary tract infection, site not specified: Secondary | ICD-10-CM | POA: Diagnosis not present

## 2022-06-21 ENCOUNTER — Other Ambulatory Visit: Payer: Self-pay | Admitting: Cardiovascular Disease

## 2022-06-21 DIAGNOSIS — I480 Paroxysmal atrial fibrillation: Secondary | ICD-10-CM

## 2022-06-21 NOTE — Telephone Encounter (Signed)
Prescription refill request for Eliquis received. Indication: Afib  Last office visit:01/30/22 (Nishan)  Scr: 0.84 (06/12/22)  Age: 67 Weight: 119.7kg  Appropriate dose. Refill sent.

## 2022-06-28 DIAGNOSIS — I482 Chronic atrial fibrillation, unspecified: Secondary | ICD-10-CM | POA: Diagnosis not present

## 2022-06-28 DIAGNOSIS — J0100 Acute maxillary sinusitis, unspecified: Secondary | ICD-10-CM | POA: Diagnosis not present

## 2022-06-28 DIAGNOSIS — R0789 Other chest pain: Secondary | ICD-10-CM | POA: Diagnosis not present

## 2022-07-05 ENCOUNTER — Encounter (HOSPITAL_COMMUNITY): Payer: Self-pay | Admitting: Pharmacy Technician

## 2022-07-05 ENCOUNTER — Other Ambulatory Visit: Payer: Self-pay

## 2022-07-05 ENCOUNTER — Ambulatory Visit (HOSPITAL_COMMUNITY)
Admission: EM | Admit: 2022-07-05 | Discharge: 2022-07-05 | Disposition: A | Discharge status: Home / Self Care | Payer: Medicare Other | Attending: Emergency Medicine | Admitting: Emergency Medicine

## 2022-07-05 ENCOUNTER — Emergency Department (HOSPITAL_COMMUNITY): Payer: Medicare Other

## 2022-07-05 ENCOUNTER — Emergency Department (HOSPITAL_COMMUNITY)
Admission: EM | Admit: 2022-07-05 | Discharge: 2022-07-05 | Disposition: A | Discharge status: Home / Self Care | Payer: Medicare Other | Attending: Emergency Medicine | Admitting: Emergency Medicine

## 2022-07-05 ENCOUNTER — Encounter (HOSPITAL_COMMUNITY): Payer: Self-pay | Admitting: Emergency Medicine

## 2022-07-05 DIAGNOSIS — I10 Essential (primary) hypertension: Secondary | ICD-10-CM | POA: Insufficient documentation

## 2022-07-05 DIAGNOSIS — E119 Type 2 diabetes mellitus without complications: Secondary | ICD-10-CM | POA: Diagnosis not present

## 2022-07-05 DIAGNOSIS — Z79899 Other long term (current) drug therapy: Secondary | ICD-10-CM | POA: Insufficient documentation

## 2022-07-05 DIAGNOSIS — R051 Acute cough: Secondary | ICD-10-CM | POA: Diagnosis not present

## 2022-07-05 DIAGNOSIS — R0789 Other chest pain: Secondary | ICD-10-CM | POA: Diagnosis not present

## 2022-07-05 DIAGNOSIS — Z7901 Long term (current) use of anticoagulants: Secondary | ICD-10-CM | POA: Insufficient documentation

## 2022-07-05 DIAGNOSIS — M7989 Other specified soft tissue disorders: Secondary | ICD-10-CM | POA: Diagnosis not present

## 2022-07-05 DIAGNOSIS — R0602 Shortness of breath: Secondary | ICD-10-CM | POA: Insufficient documentation

## 2022-07-05 DIAGNOSIS — R0600 Dyspnea, unspecified: Secondary | ICD-10-CM | POA: Diagnosis not present

## 2022-07-05 LAB — HEPATIC FUNCTION PANEL
ALT: 20 U/L (ref 0–44)
AST: 27 U/L (ref 15–41)
Albumin: 4 g/dL (ref 3.5–5.0)
Alkaline Phosphatase: 93 U/L (ref 38–126)
Bilirubin, Direct: <0.1 mg/dL (ref 0.0–0.2)
Total Bilirubin: 0.4 mg/dL (ref 0.3–1.2)
Total Protein: 7.5 g/dL (ref 6.5–8.1)

## 2022-07-05 LAB — CBC
HCT: 44.3 % (ref 36.0–46.0)
Hemoglobin: 14.2 g/dL (ref 12.0–15.0)
MCH: 26.9 pg (ref 26.0–34.0)
MCHC: 32.1 g/dL (ref 30.0–36.0)
MCV: 83.9 fL (ref 80.0–100.0)
Platelets: 295 10*3/uL (ref 150–400)
RBC: 5.28 MIL/uL — ABNORMAL HIGH (ref 3.87–5.11)
RDW: 16 % — ABNORMAL HIGH (ref 11.5–15.5)
WBC: 11.1 10*3/uL — ABNORMAL HIGH (ref 4.0–10.5)
nRBC: 0 % (ref 0.0–0.2)

## 2022-07-05 LAB — BASIC METABOLIC PANEL
Anion gap: 9 (ref 5–15)
BUN: 7 mg/dL — ABNORMAL LOW (ref 8–23)
CO2: 28 mmol/L (ref 22–32)
Calcium: 9.7 mg/dL (ref 8.9–10.3)
Chloride: 99 mmol/L (ref 98–111)
Creatinine, Ser: 0.79 mg/dL (ref 0.44–1.00)
GFR, Estimated: >60 mL/min (ref >60)
Glucose, Bld: 108 mg/dL — ABNORMAL HIGH (ref 70–99)
Potassium: 3.9 mmol/L (ref 3.5–5.1)
Sodium: 136 mmol/L (ref 135–145)

## 2022-07-05 LAB — BRAIN NATRIURETIC PEPTIDE: B Natriuretic Peptide: 54.1 pg/mL (ref 0.0–100.0)

## 2022-07-05 LAB — TROPONIN I (HIGH SENSITIVITY): Troponin I (High Sensitivity): 5 ng/L (ref <18)

## 2022-07-05 MED ORDER — FUROSEMIDE 20 MG PO TABS: 20.0000 mg | ORAL_TABLET | Freq: Every day | ORAL | 0 refills | Status: DC

## 2022-07-05 NOTE — Discharge Instructions (Signed)
You were seen in the emergency department for your shortness of breath.  You had no signs of significant fluid on your lungs or obvious heart failure.  I have given you a refill of your Lasix that you can take as needed for your shortness of breath and swelling and you can follow-up with your primary doctor and your cardiologist as planned.  You should return to the emergency department if you have any worsening shortness of breath, severe chest pain or if you have any other new or concerning symptoms.

## 2022-07-05 NOTE — ED Notes (Signed)
Lab called again d/t hepatic panel still not registering 'in process'. Lab stated they would get the test added on to previous specimen collection now

## 2022-07-05 NOTE — ED Triage Notes (Signed)
Pt reports intermittent chest tightness, chest congestion, headaches, SOB and a dry cough. States symptoms have been ongoing for 2 weeks. Has tried Mucinex for relief.   Also adds bilateral leg and ankle swelling. Has been out of Lasix for 2 days.

## 2022-07-05 NOTE — Discharge Instructions (Addendum)
It is completely reasonable to go to the emergency department to make sure that you do not have trace amount of fluid in your lungs consistent with early congestive heart failure, a problem with your heart, or a pneumonia.  Let them know if your symptoms return.  Your EKG was normal here.

## 2022-07-05 NOTE — ED Provider Notes (Signed)
Emily Andersen   CSN: [id number] Arrival date & time: 07/05/22  4098     History  Chief Complaint  Patient presents with   Chest Pain    Emily Andersen is a 67 y.o. female.  Patient is a 67 year old female with a past medical history of hypertension and diabetes presenting to the emergency department with shortness of breath.  Patient states that she has had dyspnea on exertion and occasional orthopnea over the last several months.  She states that for the last several days she has had some associated chest discomfort.  States she was initially seen at urgent care and recommended to come to the emergency department.  She states that she has had a dry cough.  She denies any fevers.  States that she has had mild swelling in her legs.  The history is provided by the patient.  Chest Pain      Home Medications Prior to Admission medications   Medication Sig Start Date End Date Taking? Authorizing Provider  furosemide (LASIX) 20 MG tablet Take 1 tablet (20 mg total) by mouth daily. 07/05/22  Yes Theresia Lo, [place] K, DO  ALPRAZolam Prudy Feeler) 0.5 MG tablet Take 0.5 mg by mouth at bedtime as needed for anxiety.    [provider]  amLODipine (NORVASC) 5 MG tablet Take 5 mg by mouth daily. 01/09/22   [provider]  apixaban (ELIQUIS) 5 MG TABS tablet TAKE 1 TABLET BY MOUTH TWICE  DAILY 06/21/22   Wendall Stade, MD  Carboxymethylcellul-Glycerin (LUBRICATING EYE DROPS OP) Place 1 drop into both eyes daily as needed (dry eyes).    [provider]  Cholecalciferol (VITAMIN D3) 2000 UNITS TABS Take 2,000 Units by mouth daily.    [provider]  diltiazem (CARDIZEM CD) 240 MG 24 hr capsule TAKE 1 CAPSULE BY MOUTH EVERY DAY 05/02/22   Wendall Stade, MD  escitalopram (LEXAPRO) 10 MG tablet Take 10 mg by mouth daily.    [provider]  flecainide (TAMBOCOR) 50 MG tablet Take 1.5 tablets  (75 mg total) by mouth 2 (two) times daily. 12/19/21   Wendall Stade, MD  furosemide (LASIX) 20 MG tablet TAKE 1 TABLET BY MOUTH DAILY AS NEEDED (SWELLING/WEIGHT GAIN). 06/15/21   Newman Nip, NP  KLOR-CON M20 20 MEQ tablet TAKE 1 TABLET (20 MEQ TOTAL) BY MOUTH DAILY AS NEEDED (WHEN LASIX IS TAKEN). 06/15/21   Newman Nip, NP  lisinopril-hydrochlorothiazide (ZESTORETIC) 20-25 MG tablet Take 1 tablet by mouth daily.    [provider]  mirtazapine (REMERON) 15 MG tablet Take 15 mg by mouth at bedtime.    [provider]  Multiple Vitamins-Minerals (CENTRUM SILVER PO) Take 1 tablet by mouth daily.    [provider]  Omega-3 Fatty Acids (FISH OIL OMEGA-3 PO) Take 1 tablet by mouth every morning.    [provider]  omeprazole (PRILOSEC OTC) 20 MG tablet Take 20 mg by mouth daily.    [provider]  pravastatin (PRAVACHOL) 20 MG tablet Take 20 mg by mouth daily.    [provider]  pyridOXINE (VITAMIN B-6) 100 MG tablet Take 100 mg by mouth daily.    [provider]  terbinafine (LAMISIL) 250 MG tablet Take 250 mg by mouth daily.    [provider]  traZODone (DESYREL) 50 MG tablet Take 50 mg by mouth at bedtime. 01/29/22   [provider]  vitamin B-12 (CYANOCOBALAMIN)  500 MCG tablet Take 500 mcg by mouth daily.    [provider]      Allergies    Patient has no known allergies.    Review of Systems   Review of Systems  Cardiovascular:  Positive for chest pain.    Physical Exam Updated Vital Signs BP (!) 144/67   Pulse 70   Temp 97.9 F (36.6 C) (Oral)   Resp 19   Ht 5\' 7"  (1.702 m)   Wt 118.4 kg   SpO2 94%   BMI 40.88 kg/m  Physical Exam Vitals and nursing Andersen reviewed.  Constitutional:      General: She is not in acute distress.    Appearance: She is well-developed.  HENT:     Head: Normocephalic and atraumatic.  Eyes:     Extraocular Movements: Extraocular movements  intact.  Neck:     Vascular: No JVD.  Cardiovascular:     Rate and Rhythm: Normal rate and regular rhythm.     Heart sounds: Normal heart sounds.  Pulmonary:     Effort: Pulmonary effort is normal.     Breath sounds: Normal breath sounds.  Abdominal:     Palpations: Abdomen is soft.     Tenderness: There is no abdominal tenderness.  Musculoskeletal:        General: Normal range of motion.     Cervical back: Normal range of motion and neck supple.     Right lower leg: Edema (1+) present.     Left lower leg: Edema (1+) present.  Skin:    General: Skin is warm and dry.  Neurological:     General: No focal deficit present.     Mental Status: She is alert and oriented to person, place, and time.  Psychiatric:        Mood and Affect: Mood normal.        Behavior: Behavior normal.     ED Results / Procedures / Treatments   Labs (all labs ordered are listed, but only abnormal results are displayed) Labs Reviewed  BASIC METABOLIC PANEL - Abnormal; Notable for the following components:      Result Value   Glucose, Bld 108 (*)    BUN 7 (*)    All other components within normal limits  CBC - Abnormal; Notable for the following components:   WBC 11.1 (*)    RBC 5.28 (*)    RDW 16.0 (*)    All other components within normal limits  BRAIN NATRIURETIC PEPTIDE  HEPATIC FUNCTION PANEL  TROPONIN I (HIGH SENSITIVITY)    EKG EKG Interpretation  Date/Time:  Thursday Jul 05 2022 09:20:56 EDT Ventricular Rate:  97 PR Interval:  203 QRS Duration: 82 QT Interval:  348 QTC Calculation: 442 R Axis:   19 Text Interpretation: Sinus rhythm Consider left atrial enlargement No significant change since last tracing today Confirmed by Elayne Snare ([id number]) on 07/05/2022 9:40:27 AM  Radiology DG Chest 2 View  Result Date: 07/05/2022 CLINICAL DATA:  Two-week history of chest tightness, congestion and shortness of breath EXAM: CHEST - 2 VIEW COMPARISON:  Chest radiograph dated 04/05/2009  FINDINGS: Normal lung volumes. No focal consolidations. No pleural effusion or pneumothorax. Enlarged cardiomediastinal silhouette. No acute osseous abnormality. IMPRESSION: 1. No focal consolidations.  No substantial pulmonary edema. 2. Cardiomegaly. Electronically Signed   By: Agustin Cree M.D.   On: 07/05/2022 10:32    Procedures Procedures    Medications Ordered in ED Medications - No data to display  ED Course/ Medical Decision Making/ A&P Clinical Course as of 07/05/22 1543  Thu Jul 05, 2022  1522 Cardiomegaly without signs of CHF on CXR and labs, and labs otherwise negative. She states she sees her cardiologist in a few weeks and recommended PCP follow up as well. She was given strict return precautions. [VK]    Clinical Course User Index [VK] Rexford Maus, DO                             Medical Decision Making This patient presents to the ED with chief complaint(s) of SOB with pertinent past medical history of HTN, DM which further complicates the presenting complaint. The complaint involves an extensive differential diagnosis and also carries with it a high risk of complications and morbidity.    The differential diagnosis includes pulmonary edema, pleural effusion, pneumonia, pneumothorax, anemia, arrhthymias, ACS   Additional history obtained: Additional history obtained from N/A Records reviewed r urgent care ecords, outpatient cardiology records  ED Course and Reassessment: On patient's arrival to the emergency department she is hemodynamically stable in no acute distress.  She had mild signs of volume overload with some edema in her legs.  Patient will have EKG, labs including BNP and chest x-ray and will be closely reassessed.  Independent labs interpretation:  The following labs were independently interpreted: Within normal range  Independent visualization of imaging: - I independently visualized the following imaging with scope of interpretation limited to  determining acute life threatening conditions related to emergency care: Chest x-ray, which revealed cardiomegaly without other acute abnormalities  Consultation: - Consulted or discussed management/test interpretation w/ external professional: N/A  Consideration for admission or further workup: Patient has no emergent conditions requiring admission or further work-up at this time and is stable for discharge home with primary care follow-up  Social Determinants of health: N/A   Amount and/or Complexity of Data Reviewed Labs: ordered. Radiology: ordered.          Final Clinical Impression(s) / ED Diagnoses Final diagnoses:  Dyspnea, unspecified type    Rx / DC Orders ED Discharge Orders          Ordered    furosemide (LASIX) 20 MG tablet  Daily        07/05/22 1524              Rexford Maus, DO 07/05/22 1543

## 2022-07-05 NOTE — ED Provider Notes (Signed)
HPI  SUBJECTIVE:  Emily Andersen is a 67 y.o. female who presents with 2 weeks of intermittent minutes long chest tightness, shortness of breath, dry cough, unintentional 10 pound weight loss and lower extremity edema which resolves with Lasix as needed.  Last dose of Lasix was yesterday.  She reports feeling as if her chest is congested.  No radiation of this tightness up her neck, through to her back, down her arm.  No chest pain, pressure, heaviness, fevers.  No nausea, diaphoresis, exertional component to it.  No wheezing, dyspnea on exertion, nocturia, orthopnea, PND, abdominal pain.  No palpitations, dizziness, lightheadedness, syncope.  No nasal congestion, rhinorrhea, sinus pain or pressure, postnasal drip.  She is unable to sleep at night secondary to the cough.  She has been taking Mucinex and Lasix with improvement in her symptoms.  Symptoms are worse with lying down.  Patient has a past medical history of atrial fibrillation on Eliquis, hyperlipidemia, diabetes, hypertension.  No history of CHF, pulmonary disease, smoking.  [facility]: [facility]  Cardiology, [facility] [address].  Patient saw cardiology in December 2030 for A-fib/bradycardia, EKG showed A-fib, patient was asymptomatic.  Past Medical History:  Diagnosis Date   Anemia    Anxiety    Arthritis    Diabetes (HCC)    GERD (gastroesophageal reflux disease)    Hyperlipidemia    Hypertension    Obesity    Palpitations    Uterine fibroid     Past Surgical History:  Procedure Laterality Date   ABDOMINAL HYSTERECTOMY  1974   CARDIOVERSION N/A 05/09/2021   Procedure: CARDIOVERSION;  Surgeon: Thurmon Fair, MD;  Location: [facility];  Service: Cardiovascular;  Laterality: N/A;   COLONOSCOPY     TOTAL KNEE ARTHROPLASTY Left 2006   UMBILICAL HERNIA REPAIR  2000    Family History  Problem Relation Age of Onset   Heart disease Father    Alzheimer's disease Mother    Colon polyps Sister     Breast cancer Sister    Emphysema Sister        smoker   Emphysema Brother        smoker   Cancer Maternal Grandmother        type unknown    Social History   Tobacco Use   Smoking status: Never   Smokeless tobacco: Never  Vaping Use   Vaping Use: Never used  Substance Use Topics   Alcohol use: No   Drug use: No    No current facility-administered medications for this encounter.  Current Outpatient Medications:    ALPRAZolam (XANAX) 0.5 MG tablet, Take 0.5 mg by mouth at bedtime as needed for anxiety., Disp: , Rfl:    amLODipine (NORVASC) 5 MG tablet, Take 5 mg by mouth daily., Disp: , Rfl:    apixaban (ELIQUIS) 5 MG TABS tablet, TAKE 1 TABLET BY MOUTH TWICE  DAILY, Disp: 200 tablet, Rfl: 2   Carboxymethylcellul-Glycerin (LUBRICATING EYE DROPS OP), Place 1 drop into both eyes daily as needed (dry eyes)., Disp: , Rfl:    Cholecalciferol (VITAMIN D3) 2000 UNITS TABS, Take 2,000 Units by mouth daily., Disp: , Rfl:    diltiazem (CARDIZEM CD) 240 MG 24 hr capsule, TAKE 1 CAPSULE BY MOUTH EVERY DAY, Disp: 90 capsule, Rfl: 2   escitalopram (LEXAPRO) 10 MG tablet, Take 10 mg by mouth daily., Disp: , Rfl:    flecainide (TAMBOCOR) 50 MG tablet, Take 1.5 tablets (75 mg total) by mouth 2 (two) times  daily., Disp: 270 tablet, Rfl: 3   furosemide (LASIX) 20 MG tablet, TAKE 1 TABLET BY MOUTH DAILY AS NEEDED (SWELLING/WEIGHT GAIN)., Disp: 30 tablet, Rfl: 1   KLOR-CON M20 20 MEQ tablet, TAKE 1 TABLET (20 MEQ TOTAL) BY MOUTH DAILY AS NEEDED (WHEN LASIX IS TAKEN)., Disp: 30 tablet, Rfl: 1   lisinopril-hydrochlorothiazide (ZESTORETIC) 20-25 MG tablet, Take 1 tablet by mouth daily., Disp: , Rfl:    mirtazapine (REMERON) 15 MG tablet, Take 15 mg by mouth at bedtime., Disp: , Rfl:    Multiple Vitamins-Minerals (CENTRUM SILVER PO), Take 1 tablet by mouth daily., Disp: , Rfl:    Omega-3 Fatty Acids (FISH OIL OMEGA-3 PO), Take 1 tablet by mouth every morning., Disp: , Rfl:    omeprazole (PRILOSEC OTC)  20 MG tablet, Take 20 mg by mouth daily., Disp: , Rfl:    pravastatin (PRAVACHOL) 20 MG tablet, Take 20 mg by mouth daily., Disp: , Rfl:    pyridOXINE (VITAMIN B-6) 100 MG tablet, Take 100 mg by mouth daily., Disp: , Rfl:    terbinafine (LAMISIL) 250 MG tablet, Take 250 mg by mouth daily., Disp: , Rfl:    traZODone (DESYREL) 50 MG tablet, Take 50 mg by mouth at bedtime., Disp: , Rfl:    vitamin B-12 (CYANOCOBALAMIN) 500 MCG tablet, Take 500 mcg by mouth daily., Disp: , Rfl:   No Known Allergies   ROS  As noted in HPI.   Physical Exam  BP 112/78 (BP Location: Right Arm)   Pulse 89   Temp 97.6 F (36.4 C) (Oral)   Resp 18   SpO2 94%   Constitutional: Well developed, well nourished, no acute distress Eyes:  EOMI, conjunctiva normal bilaterally HENT: Normocephalic, atraumatic,mucus membranes moist.  No nasal congestion.  No sinus tenderness. Respiratory: Normal inspiratory effort, lungs clear bilaterally Cardiovascular: Normal rate, regular rhythm, no murmurs, rubs, gallops GI: nondistended skin: No rash, skin intact.  1-2+ edema bilateral lower extremities.  Extremities nontender. Musculoskeletal: no deformities Neurologic: Alert & oriented x 3, no focal neuro deficits Psychiatric: Speech and behavior appropriate   ED Course   Medications - No data to display  Orders Placed This Encounter  Procedures   EKG 12-Lead    Standing Status:   Standing    Number of Occurrences:   1    No results found for this or any previous visit (from the past 24 hour(s)). No results found.  ED Clinical Impression  1. Chest tightness   2. Acute cough   3. Swelling of lower extremity      ED Assessment/Plan     Outside records reviewed.  As noted in HPI.  EKG: Normal sinus rhythm, rate 85.  Left axis deviation.  No hypertrophy.  No ST-T wave changes.  No change compared to EKG from 12/2021  In the differential is an upper respiratory infection,, lower respiratory tract  infection, mild congestive heart failure.  Lower in the differential is ACS, pneumonia.  Discussed with patient that because her EKG is normal and her vitals are normal, that we could do a workup here with a chest x-ray and labs to make sure that it is not acute congestive heart failure, but that I would be calling her with any abnormal results and that she would need to go to the emergency department if this is the case.  Patient states that she would like to go to the emergency department for evaluation.  She is stable to go via private vehicle.  Discussed  EKG, medical decision making, plan for treatment with patient.  She agrees with plan.  No orders of the defined types were placed in this encounter.     *This clinic note was created using Dragon dictation software. Therefore, there may be occasional mistakes despite careful proofreading.  ?    Domenick Gong, MD 07/05/22 [phone]

## 2022-07-05 NOTE — ED Notes (Signed)
Lab called and stated they could add on the hepatic function panel to previously sent down specimens

## 2022-07-05 NOTE — ED Triage Notes (Signed)
Pt  here with reports of chest tightness, congestion, headache and shob for the last few weeks. Seen at [facility] and sent here for further evaluation of possible chf.

## 2022-07-09 ENCOUNTER — Telehealth: Payer: Self-pay | Admitting: *Deleted

## 2022-07-09 NOTE — Telephone Encounter (Signed)
Transition Care Management Unsuccessful Follow-up Telephone Call  Date of discharge and from where:  [facility] 07/04/2022  Attempts:  1st Attempt  Reason for unsuccessful TCM follow-up call:  Left voice message   Alois Cliche -[facility] [place], Population Health [phone] [address] [place] , [place] [address] Email : [email or web address]

## 2022-07-10 ENCOUNTER — Telehealth: Payer: Self-pay | Admitting: *Deleted

## 2022-07-10 NOTE — Telephone Encounter (Signed)
Date of discharge and from where:  [facility] 07/04/2022   Attempts:  2ND Attempt   Reason for unsuccessful TCM follow-up call:  Left voice message    Alois Cliche -[facility] [place] [facility] [place], Population Health [phone] [address] [place] , [place] [address] Email : [email or web address]

## 2022-07-24 ENCOUNTER — Telehealth: Payer: Self-pay | Admitting: Cardiovascular Disease

## 2022-07-24 DIAGNOSIS — I480 Paroxysmal atrial fibrillation: Secondary | ICD-10-CM

## 2022-07-24 MED ORDER — APIXABAN 5 MG PO TABS
5.0000 mg | ORAL_TABLET | Freq: Two times a day (BID) | ORAL | 1 refills | Status: DC
Start: 2022-07-24 — End: 2022-12-24

## 2022-07-24 NOTE — Telephone Encounter (Signed)
Returned call to [employer] and spoke to [employer] and she stats the pt is enrolled in [employer] program for free medication and need the date when the MD signed so they can enter it in since it was in question (she states it has 01/09/22 versus 01/14/22). Per 01/09/22 telephone call it states by another RN it was signed by MD on 01/09/22. She states she will update the form and prepare rx to be sent out.   Last Rx they have is from 12/2021 the last one was sent to [facility] so a refill is needed to be sent to [facility].   Eliquis 5mg  refill request received. Patient is 67 years old, weight-118.4kg, Crea-0.79 on 07/05/22, Diagnosis-Afib, and last seen by Dr. Eden Emms on 01/30/22. Dose is appropriate based on dosing criteria. Will send in refill to requested pharmacy.

## 2022-07-24 NOTE — Telephone Encounter (Signed)
Pt c/o medication issue:  1. Name of Medication:   apixaban (ELIQUIS) 5 MG TABS tablet    2. How are you currently taking this medication (dosage and times per day)? As written   3. Are you having a reaction (difficulty breathing--STAT)? No   4. What is your medication issue? Irving Burton with [facility] called in she states she needs to some additional information on this medication and ask that clinical staff c/b

## 2022-07-30 NOTE — Progress Notes (Signed)
CARDIOLOGY CONSULT NOTE       Patient ID: BRAE SCHAAFSMA MRN: [id number] DOB/AGE: June 05, 1955 67 y.o.  Admit date: (Not on file) Referring Physician: Sherwood Gambler Primary Physician: Assunta Found, MD Primary Cardiologist: Eden Emms   HPI:  67 y.o. referred by Dr Sherwood Gambler for PAF First seen on 04/18/21 History of DM, Anxiety, HTN and palpitations Seen in office by primary 03/24/21 thought to be in new onset afib. Started on coumadin and atenolol Poor quality tracing from their office shows fib/flutter rate 86 bpm nonspecific ST change Started on lopressor and eliquis [facility] on 05/09/21 with Dr Royann Shivers Had frequent PACls and post conversion SB rates 40's Monitor with some junctional rhythm lopressor dose decreased TTE 05/03/21 with EF 50-55% no valve dx LA 51 mm   Still on cardizem 240 mg daily and Lopressor 12.5 mg bid   Office visit 10/03/21  ERAF rate 112 She does not realized it but given post [facility] PACls suspected started on flecainide ETT no QRS widening or arrhythmia but only went 74% predicted maximal HR 10/17/21 Converted to NSR and was in NSR when seen in afib clinic 11/01/21  ECG sinus bradycardia rate 46 no AV block   12/29/21 Still on lopressor 12.5 mg bid and cardizem 240 mg daily for AV nodal drugs Compliant with eliquis   12/29/21 Discussed SSS and stopping lopressor given HR in 40's She will take once/day for 2 weeks then stop   She is much better now with HR mid to upper 50's No recurrence of PAF  Primary placed her on norvasc 5 mg for BP control   Needs an inhaler for asthma/allergies  ROS All other systems reviewed and negative except as noted above  Past Medical History:  Diagnosis Date   Anemia    Anxiety    Arthritis    Diabetes (HCC)    GERD (gastroesophageal reflux disease)    Hyperlipidemia    Hypertension    Obesity    Palpitations    Uterine fibroid     Family History  Problem Relation Age of Onset   Heart disease Father    Alzheimer's disease Mother    Colon  polyps Sister    Breast cancer Sister    Emphysema Sister        smoker   Emphysema Brother        smoker   Cancer Maternal Grandmother        type unknown    Social History   Socioeconomic History   Marital status: Married    Spouse name: Not on file   Number of children: 1   Years of education: Not on file   Highest education level: Not on file  Occupational History   Occupation: retired Lawyer  Tobacco Use   Smoking status: Never   Smokeless tobacco: Never  Vaping Use   Vaping Use: Never used  Substance and Sexual Activity   Alcohol use: No   Drug use: No   Sexual activity: Not on file  Other Topics Concern   Not on file  Social History Narrative   Not on file   Social Determinants of Health   Financial Resource Strain: Not on file  Food Insecurity: Not on file  Transportation Needs: Not on file  Physical Activity: Not on file  Stress: Not on file  Social Connections: Not on file  Intimate Partner Violence: Not on file    Past Surgical History:  Procedure Laterality Date   ABDOMINAL HYSTERECTOMY  1974  CARDIOVERSION N/A 05/09/2021   Procedure: CARDIOVERSION;  Surgeon: Thurmon Fair, MD;  Location: [facility];  Service: Cardiovascular;  Laterality: N/A;   COLONOSCOPY     TOTAL KNEE ARTHROPLASTY Left 2006   UMBILICAL HERNIA REPAIR  2000      Current Outpatient Medications:    ALPRAZolam (XANAX) 0.5 MG tablet, Take 0.5 mg by mouth at bedtime as needed for anxiety., Disp: , Rfl:    amLODipine (NORVASC) 5 MG tablet, Take 5 mg by mouth daily., Disp: , Rfl:    apixaban (ELIQUIS) 5 MG TABS tablet, Take 1 tablet (5 mg total) by mouth 2 (two) times daily., Disp: 180 tablet, Rfl: 1   Carboxymethylcellul-Glycerin (LUBRICATING EYE DROPS OP), Place 1 drop into both eyes daily as needed (dry eyes)., Disp: , Rfl:    Cholecalciferol (VITAMIN D3) 2000 UNITS TABS, Take 2,000 Units by mouth daily., Disp: , Rfl:    diltiazem (CARDIZEM CD) 240 MG 24 hr capsule, TAKE 1  CAPSULE BY MOUTH EVERY DAY, Disp: 90 capsule, Rfl: 2   flecainide (TAMBOCOR) 50 MG tablet, Take 1.5 tablets (75 mg total) by mouth 2 (two) times daily., Disp: 270 tablet, Rfl: 3   lisinopril-hydrochlorothiazide (ZESTORETIC) 20-25 MG tablet, Take 1 tablet by mouth daily., Disp: , Rfl:    Multiple Vitamins-Minerals (CENTRUM SILVER PO), Take 1 tablet by mouth daily., Disp: , Rfl:    Omega-3 Fatty Acids (FISH OIL OMEGA-3 PO), Take 1 tablet by mouth every morning., Disp: , Rfl:    omeprazole (PRILOSEC OTC) 20 MG tablet, Take 20 mg by mouth daily., Disp: , Rfl:    pravastatin (PRAVACHOL) 20 MG tablet, Take 20 mg by mouth daily., Disp: , Rfl:    pyridOXINE (VITAMIN B-6) 100 MG tablet, Take 100 mg by mouth daily., Disp: , Rfl:    traZODone (DESYREL) 50 MG tablet, Take 50 mg by mouth at bedtime., Disp: , Rfl:    vitamin B-12 (CYANOCOBALAMIN) 500 MCG tablet, Take 500 mcg by mouth daily., Disp: , Rfl:    escitalopram (LEXAPRO) 10 MG tablet, Take 10 mg by mouth daily. (Patient not taking: Reported on 08/13/2022), Disp: , Rfl:    furosemide (LASIX) 20 MG tablet, TAKE 1 TABLET BY MOUTH DAILY AS NEEDED (SWELLING/WEIGHT GAIN). (Patient not taking: Reported on 08/13/2022), Disp: 30 tablet, Rfl: 1   furosemide (LASIX) 20 MG tablet, Take 1 tablet (20 mg total) by mouth daily. (Patient not taking: Reported on 08/13/2022), Disp: 7 tablet, Rfl: 0   KLOR-CON M20 20 MEQ tablet, TAKE 1 TABLET (20 MEQ TOTAL) BY MOUTH DAILY AS NEEDED (WHEN LASIX IS TAKEN). (Patient not taking: Reported on 08/13/2022), Disp: 30 tablet, Rfl: 1   mirtazapine (REMERON) 15 MG tablet, Take 15 mg by mouth at bedtime. (Patient not taking: Reported on 08/13/2022), Disp: , Rfl:    terbinafine (LAMISIL) 250 MG tablet, Take 250 mg by mouth daily. (Patient not taking: Reported on 08/13/2022), Disp: , Rfl:     Physical Exam: Blood pressure 130/66, pulse 75, height 5\' 7"  (1.702 m), weight 256 lb (116.1 kg), SpO2 93 %.    Affect appropriate Overweight black  female HEENT: normal Neck supple with no adenopathy JVP normal no bruits no thyromegaly Lungs clear with no wheezing and good diaphragmatic motion Heart:  S1/S2 no murmur, no rub, gallop or click PMI normal Abdomen: benighn, BS positve, no tenderness, no AAA no bruit.  No HSM or HJR Distal pulses intact with no bruits No edema Neuro non-focal Skin warm and dry No muscular weakness  Labs:   Lab Results  Component Value Date   WBC 11.1 (H) 07/05/2022   HGB 14.2 07/05/2022   HCT 44.3 07/05/2022   MCV 83.9 07/05/2022   PLT 295 07/05/2022   No results for input(s): "NA", "K", "CL", "CO2", "BUN", "CREATININE", "CALCIUM", "PROT", "BILITOT", "ALKPHOS", "ALT", "AST", "GLUCOSE" in the last 168 hours.  Invalid input(s): "LABALBU" No results found for: "CKTOTAL", "CKMB", "CKMBINDEX", "TROPONINI" No results found for: "CHOL" No results found for: "HDL" No results found for: "LDLCALC" No results found for: "TRIG" No results found for: "CHOLHDL" No results found for: "LDLDIRECT"    Radiology: No results found.  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

## 2022-08-13 ENCOUNTER — Ambulatory Visit: Payer: Medicare Other | Attending: Cardiovascular Disease | Admitting: Cardiovascular Disease

## 2022-08-13 ENCOUNTER — Encounter: Payer: Self-pay | Admitting: Cardiovascular Disease

## 2022-08-13 VITALS — BP 130/66 | HR 75 | Ht 67.0 in | Wt 256.0 lb

## 2022-08-13 DIAGNOSIS — R001 Bradycardia, unspecified: Secondary | ICD-10-CM

## 2022-08-13 DIAGNOSIS — I10 Essential (primary) hypertension: Secondary | ICD-10-CM | POA: Diagnosis not present

## 2022-08-13 DIAGNOSIS — I480 Paroxysmal atrial fibrillation: Secondary | ICD-10-CM | POA: Diagnosis not present

## 2022-08-13 MED ORDER — ALBUTEROL SULFATE HFA 108 (90 BASE) MCG/ACT IN AERS: 2.0000 | INHALATION_SPRAY | Freq: Four times a day (QID) | RESPIRATORY_TRACT | 2 refills | Status: DC | PRN

## 2022-08-13 NOTE — Patient Instructions (Addendum)
Medication Instructions:  Your physician has recommended you make the following change in your medication:  1-Use albuterol take 2 puff of inhaler as needed for wheezing and shortness of breath every 6 hours.   *If you need a refill on your cardiac medications before your next appointment, please call your pharmacy*  Lab Work: If you have labs (blood work) drawn today and your tests are completely normal, you will receive your results only by: MyChart Message (if you have MyChart) OR A paper copy in the mail If you have any lab test that is abnormal or we need to change your treatment, we will call you to review the results.   Testing/Procedures: None ordered today.  Follow-Up: At [facility], you and your health needs are our priority.  As part of our continuing mission to provide you with exceptional heart care, we have created designated Provider Care Teams.  These Care Teams include your primary Cardiologist (physician) and Advanced Practice Providers (APPs -  Physician Assistants and Nurse Practitioners) who all work together to provide you with the care you need, when you need it.  We recommend signing up for the patient portal called "MyChart".  Sign up information is provided on this After Visit Summary.  MyChart is used to connect with patients for Virtual Visits (Telemedicine).  Patients are able to view lab/test results, encounter notes, upcoming appointments, etc.  Non-urgent messages can be sent to your provider as well.   To learn more about what you can do with MyChart, go to [email or web address].    Your next appointment:   1 year(s)  Provider:   Charlton Haws, MD

## 2022-08-30 ENCOUNTER — Encounter: Payer: Self-pay | Admitting: Internal Medicine

## 2022-11-19 ENCOUNTER — Other Ambulatory Visit: Payer: Self-pay | Admitting: Cardiovascular Disease

## 2022-11-23 ENCOUNTER — Other Ambulatory Visit: Payer: Self-pay | Admitting: Cardiovascular Disease

## 2022-12-07 DIAGNOSIS — E119 Type 2 diabetes mellitus without complications: Secondary | ICD-10-CM | POA: Diagnosis not present

## 2022-12-24 ENCOUNTER — Other Ambulatory Visit: Payer: Self-pay

## 2022-12-24 DIAGNOSIS — I480 Paroxysmal atrial fibrillation: Secondary | ICD-10-CM

## 2022-12-24 MED ORDER — APIXABAN 5 MG PO TABS
5.0000 mg | ORAL_TABLET | Freq: Two times a day (BID) | ORAL | 1 refills | Status: DC
Start: 2022-12-24 — End: 2023-08-28

## 2022-12-24 NOTE — Telephone Encounter (Signed)
Prescription refill request for Eliquis received. Indication:afib Last office visit:6/24 Scr:0.79  5/24 Age: 67 Weight:116.1  kg  Prescription refilled

## 2023-01-03 ENCOUNTER — Telehealth: Payer: Self-pay

## 2023-01-03 NOTE — Telephone Encounter (Signed)
Pt's Emily Andersen patient assistance application was scanned to [employer], [facility] email. FYI

## 2023-01-03 NOTE — Telephone Encounter (Signed)
 RECEIVED DOCUMENTS, REVIEWING

## 2023-01-07 NOTE — Telephone Encounter (Addendum)
Missing information, did not receive RX expense report.  PAP: PAP application for eliquis, ([employer] ([employer])) has been mailed to pt's home address on file. Will fax provider portion of application to provider's office when pt's portion is received.

## 2023-01-19 ENCOUNTER — Other Ambulatory Visit: Payer: Self-pay | Admitting: Cardiovascular Disease

## 2023-02-15 DIAGNOSIS — Z1231 Encounter for screening mammogram for malignant neoplasm of breast: Secondary | ICD-10-CM | POA: Diagnosis not present

## 2023-02-17 ENCOUNTER — Other Ambulatory Visit: Payer: Self-pay | Admitting: Cardiovascular Disease

## 2023-03-07 ENCOUNTER — Other Ambulatory Visit: Payer: Self-pay | Admitting: Cardiovascular Disease

## 2023-03-18 ENCOUNTER — Telehealth: Payer: Self-pay | Admitting: Cardiovascular Disease

## 2023-03-18 NOTE — Telephone Encounter (Signed)
Patient dropped off prescription assistance forms - I will be placing this in Nishan's box for completion.  Thank you.

## 2023-04-02 ENCOUNTER — Telehealth: Payer: Self-pay

## 2023-04-02 ENCOUNTER — Other Ambulatory Visit (HOSPITAL_COMMUNITY): Payer: Self-pay

## 2023-04-02 NOTE — Telephone Encounter (Signed)
App has been submitted. Patient is aware it will likley be denied due to [facility] sending for 2025 not yet met. Informed patient of MPP option.

## 2023-04-02 NOTE — Telephone Encounter (Signed)
Pt calling to update on paperwork, requesting cb

## 2023-04-02 NOTE — Telephone Encounter (Signed)
PAP: Application for Eliquis has been submitted to [employer] ([employer]), via fax If patient needs an update in the meantime, please refer them to [employer] at [phone]

## 2023-04-15 ENCOUNTER — Encounter: Payer: Self-pay | Admitting: Gastroenterology

## 2023-04-16 NOTE — Telephone Encounter (Signed)
 PAP: Patient has been denied for patient assistance by [employer] ([employer]) due to reason below:  also full denial scanned in media

## 2023-05-07 NOTE — Progress Notes (Unsigned)
 Name: Emily Andersen DOB: [birth year] MRN: [id number]  History of Present Illness: Ms. Bracken is a 68 y.o. female who presents today as a new patient at [facility]. All available relevant medical records have been reviewed.  ***She is accompanied by ***. - GU History: 1. ***.  Today: She reports chief complaint of urinary frequency, ***nocturia, ***urgency, and ***urge incontinence. Voiding ***x/day and ***x/night on average. Leaking ***x/day on average; using *** ***pads / ***diapers per day on average.  She {Actions; denies-reports:120008} prior attempted treatment for these symptoms ***including ***  She {Actions; denies-reports:120008} significant caffeine intake (*** caffeinated beverages per day on average). She {does/does not:200015} take a daily diuretic use.  She {Actions; denies-reports:120008} history of obstructive sleep apnea and {Actions; denies-reports:120008} wearing CPAP routinely. ***She denies ever having a sleep study before. She {Actions; denies-reports:120008} fluid intake within 3 hours prior to bedtime. She {Actions; denies-reports:120008} fluid intake during the night.  She {Actions; denies-reports:120008} caffeine intake within 8 hours prior to bedtime. She {Actions; denies-reports:120008} routinely experiencing lower extremity edema during the day. ***She {Actions; denies-reports:120008} elevating feet during the day and/or wearing compression socks when lower extremity edema is present during the day. ***She {Actions; denies-reports:120008} monitoring dietary salt and sodium intake.  She {Actions; denies-reports:120008} dysuria, gross hematuria, straining to void, or sensations of incomplete emptying.  Medications: Current Outpatient Medications  Medication Sig Dispense Refill   albuterol (VENTOLIN HFA) 108 (90 Base) MCG/ACT inhaler TAKE 2 PUFFS BY MOUTH EVERY 6 HOURS AS NEEDED FOR WHEEZE OR SHORTNESS OF BREATH 6.7 each 2    ALPRAZolam (XANAX) 0.5 MG tablet Take 0.5 mg by mouth at bedtime as needed for anxiety.     amLODipine (NORVASC) 5 MG tablet Take 5 mg by mouth daily.     apixaban (ELIQUIS) 5 MG TABS tablet Take 1 tablet (5 mg total) by mouth 2 (two) times daily. 180 tablet 1   Carboxymethylcellul-Glycerin (LUBRICATING EYE DROPS OP) Place 1 drop into both eyes daily as needed (dry eyes).     Cholecalciferol (VITAMIN D3) 2000 UNITS TABS Take 2,000 Units by mouth daily.     diltiazem (CARDIZEM CD) 240 MG 24 hr capsule TAKE 1 CAPSULE BY MOUTH EVERY DAY 90 capsule 1   escitalopram (LEXAPRO) 10 MG tablet Take 10 mg by mouth daily. (Patient not taking: Reported on 08/13/2022)     flecainide (TAMBOCOR) 50 MG tablet TAKE 1.5 TABLETS BY MOUTH 2 TIMES DAILY. 270 tablet 2   furosemide (LASIX) 20 MG tablet TAKE 1 TABLET BY MOUTH DAILY AS NEEDED (SWELLING/WEIGHT GAIN). (Patient not taking: Reported on 08/13/2022) 30 tablet 1   furosemide (LASIX) 20 MG tablet Take 1 tablet (20 mg total) by mouth daily. (Patient not taking: Reported on 08/13/2022) 7 tablet 0   KLOR-CON M20 20 MEQ tablet TAKE 1 TABLET (20 MEQ TOTAL) BY MOUTH DAILY AS NEEDED (WHEN LASIX IS TAKEN). (Patient not taking: Reported on 08/13/2022) 30 tablet 1   lisinopril-hydrochlorothiazide (ZESTORETIC) 20-25 MG tablet Take 1 tablet by mouth daily.     mirtazapine (REMERON) 15 MG tablet Take 15 mg by mouth at bedtime. (Patient not taking: Reported on 08/13/2022)     Multiple Vitamins-Minerals (CENTRUM SILVER PO) Take 1 tablet by mouth daily.     Omega-3 Fatty Acids (FISH OIL OMEGA-3 PO) Take 1 tablet by mouth every morning.     omeprazole (PRILOSEC OTC) 20 MG tablet Take 20 mg by mouth daily.     pravastatin (PRAVACHOL) 20 MG tablet Take 20 mg  by mouth daily.     pyridOXINE (VITAMIN B-6) 100 MG tablet Take 100 mg by mouth daily.     terbinafine (LAMISIL) 250 MG tablet Take 250 mg by mouth daily. (Patient not taking: Reported on 08/13/2022)     traZODone (DESYREL) 50 MG  tablet Take 50 mg by mouth at bedtime.     vitamin B-12 (CYANOCOBALAMIN) 500 MCG tablet Take 500 mcg by mouth daily.     No current facility-administered medications for this visit.    Allergies: No Known Allergies  Past Medical History:  Diagnosis Date   Anemia    Anxiety    Arthritis    Diabetes (HCC)    GERD (gastroesophageal reflux disease)    Hyperlipidemia    Hypertension    Obesity    Palpitations    Uterine fibroid    Past Surgical History:  Procedure Laterality Date   ABDOMINAL HYSTERECTOMY  1974   CARDIOVERSION N/A 05/09/2021   Procedure: CARDIOVERSION;  Surgeon: Thurmon Fair, MD;  Location: [facility];  Service: Cardiovascular;  Laterality: N/A;   COLONOSCOPY     TOTAL KNEE ARTHROPLASTY Left 2006   UMBILICAL HERNIA REPAIR  2000   Family History  Problem Relation Age of Onset   Heart disease Father    Alzheimer's disease Mother    Colon polyps Sister    Breast cancer Sister    Emphysema Sister        smoker   Emphysema Brother        smoker   Cancer Maternal Grandmother        type unknown   Social History   Socioeconomic History   Marital status: Married    Spouse name: Not on file   Number of children: 1   Years of education: Not on file   Highest education level: Not on file  Occupational History   Occupation: retired Lawyer  Tobacco Use   Smoking status: Never   Smokeless tobacco: Never  Vaping Use   Vaping status: Never Used  Substance and Sexual Activity   Alcohol use: No   Drug use: No   Sexual activity: Not on file  Other Topics Concern   Not on file  Social History Narrative   Not on file   Social Drivers of Health   Financial Resource Strain: Not on file  Food Insecurity: Not on file  Transportation Needs: Not on file  Physical Activity: Not on file  Stress: Not on file  Social Connections: Not on file  Intimate Partner Violence: Not on file    SUBJECTIVE  Review of Systems Constitutional: Patient denies any  unintentional weight loss or change in strength lntegumentary: Patient denies any rashes or pruritus Eyes: Patient {Actions; denies-reports:120008} dry eyes ENT: Patient {Actions; denies-reports:120008} dry mouth Cardiovascular: Patient denies chest pain or syncope Respiratory: Patient denies shortness of breath Gastrointestinal: ***Patient {Actions; denies-reports:120008} ***nausea, ***vomiting, ***constipation, ***diarrhea ***As per HPI Musculoskeletal: Patient denies muscle cramps or weakness Neurologic: Patient denies convulsions or seizures Allergic/Immunologic: Patient denies recent allergic reaction(s) Hematologic/Lymphatic: Patient denies bleeding tendencies Endocrine: Patient denies heat/cold intolerance  GU: As per HPI.  OBJECTIVE There were no vitals filed for this visit. There is no height or weight on file to calculate BMI.  Physical Examination Constitutional: No obvious distress; patient is non-toxic appearing  Cardiovascular: No visible lower extremity edema.  Respiratory: The patient does not have audible wheezing/stridor; respirations do not appear labored  Gastrointestinal: Abdomen non-distended Musculoskeletal: Normal ROM of UEs  Skin: No obvious rashes/open  sores  Neurologic: CN 2-12 grossly intact Psychiatric: Answered questions appropriately with normal affect  Hematologic/Lymphatic/Immunologic: No obvious bruises or sites of spontaneous bleeding  UA:  ***positive for *** leukocytes, *** blood, ***nitrites ***Urine microscopy:  ***negative  *** WBC/hpf, *** RBC/hpf, *** bacteria ***with no evidence of UTI ***with no evidence of microscopic hematuria ***otherwise unremarkable ***glucosuria (secondary to ***Jardiance ***Farxiga use)  PVR: *** ml  ASSESSMENT No diagnosis found.  We discussed the symptoms of overactive bladder (OAB), which include urinary urgency, frequency, nocturia, with or without urge incontinence.   While we may not know the  exact etiology of OAB, several risk factors can be identified.  - We discussed this patient's neurogenic risk factors for OAB-type symptoms including ***T2DM ***with neuropathy, ***nicotine use, ***spinal stenosis, ***prior stroke, ***dementia.  - Likely exacerbated by ***diuretic use, ***caffeine intake, ***glucosuria (due to ***Jardiance / ***Farxiga use), ***ambulatory dysfunction (functional incontinence).   We discussed the following management options in detail including potential benefits, risks, and side effects: Behavioral therapy: Modify fluid intake Decreasing bladder irritants (such as caffeine) Urge suppression strategies Bladder retraining / timed voiding Double voiding Medication(s): - Anticholinergic medications: ***We discussed potential side effects of anticholinergic medications such as urinary retention, dry eyes, dry mouth, constipation, confusion, cognitive impairment / dementia.  ***Not a safe candidate for anticholinergic medications due to risk for side effects based on patient's age, comorbidities, and pre-existing ***dry mouth ***dry eyes ***constipation ***Parkinsons disease ***MS.  - Beta-3 agonist medications: We discussed potential side effects of beta-3 agonist medications such as urinary retention and (infrequently) elevated blood pressure.  - Combination therapy with anticholinergic medication + beta-3 agonist medication. 3. For refractory cases: PTNS (posterior tibial nerve stimulation) ***Not a safe candidate for PTNS due to ***bleeding disorder, ***anticoagulant use, ***pregnancy, ***pacemaker, ***implanted cardiac defibrillator (ICD), ***neuropathy / nerve damage / nerve conduction disorder, ***lower extremity metal implant(s).  Sacral neuromodulation trial (Medtronic lnterStim or Axonics implant) Bladder Botox injections  She decided to proceed with *** ***work on behavioral modifications including ***minimizing caffeine intake and working on ***timed  voiding / bladder retraining.  Will plan for follow up in *** weeks / *** months or sooner if needed. Pt verbalized understanding and agreement. All questions were answered.  PLAN Advised the following: *** ***. Minimize caffeine intake. ***. Work on timed voiding / bladder retraining. ***. Double/ triple voiding. ***. No follow-ups on file.  No orders of the defined types were placed in this encounter.   It has been explained that the patient is to follow regularly with their PCP in addition to all other providers involved in their care and to follow instructions provided by these respective offices. Patient advised to contact urology clinic if any urologic-pertaining questions, concerns, new symptoms or problems arise in the interim period.  There are no Patient Instructions on file for this visit.  Electronically signed by:  Donnita Falls, MSN, FNP-C, CUNP 05/07/2023 8:08 AM

## 2023-05-08 ENCOUNTER — Encounter: Payer: Self-pay | Admitting: Urology

## 2023-05-08 ENCOUNTER — Ambulatory Visit: Payer: Medicare Other | Admitting: Urology

## 2023-05-08 VITALS — BP 129/72 | HR 114

## 2023-05-08 DIAGNOSIS — N3281 Overactive bladder: Secondary | ICD-10-CM | POA: Diagnosis not present

## 2023-05-08 DIAGNOSIS — R350 Frequency of micturition: Secondary | ICD-10-CM

## 2023-05-08 DIAGNOSIS — R351 Nocturia: Secondary | ICD-10-CM

## 2023-05-08 LAB — URINALYSIS, ROUTINE W REFLEX MICROSCOPIC
Bilirubin, UA: NEGATIVE
Glucose, UA: NEGATIVE
Ketones, UA: NEGATIVE
Nitrite, UA: NEGATIVE
Protein,UA: NEGATIVE
RBC, UA: NEGATIVE
Specific Gravity, UA: 1.02 (ref 1.005–1.030)
Urobilinogen, Ur: 0.2 mg/dL (ref 0.2–1.0)
pH, UA: 6 (ref 5.0–7.5)

## 2023-05-08 LAB — MICROSCOPIC EXAMINATION: Bacteria, UA: NONE SEEN

## 2023-05-08 MED ORDER — GEMTESA 75 MG PO TABS
1.0000 | ORAL_TABLET | Freq: Every day | ORAL | Status: DC
Start: 2023-05-08 — End: 2023-07-12

## 2023-05-08 NOTE — Progress Notes (Signed)
 Bladder Scan completed today.  Patient can void prior to the bladder scan. Bladder scan result: 13   Performed By: Alfonse Spruce. CMA  Additional notes-

## 2023-05-08 NOTE — Patient Instructions (Signed)

## 2023-05-25 ENCOUNTER — Other Ambulatory Visit: Payer: Self-pay | Admitting: Cardiovascular Disease

## 2023-05-28 NOTE — Progress Notes (Unsigned)
 Chief Complaint: Primary GI MD: Dr. Marina Goodell  HPI: 68 year old female with history of diabetes, anxiety, hypertension, PAF (Eliquis), and others as listed below presents for evaluation of  Last seen 2021 by Dr. Marina Goodell for GERD and intermittent solid food dysphagia. Was well controlled on omeprazole 20mg  once daily.  Discussed the use of AI scribe software for clinical note transcription with the patient, who gave verbal consent to proceed.  History of Present Illness      PREVIOUS GI WORKUP   EGD 08/2019 for dysphagia - Normal esophagus. Dilated at 54Fr - Normal stomach, save incidental benign fundic gland type polyps. Small hiatal hernia.  - Normal examined duodenum.  - No specimens collected.  Colonoscopy 10/2012 -- mild diverticulosis -- otherwise normal colonoscopy -- repeat 10 years (10/2022)  Past Medical History:  Diagnosis Date   Anemia    Anxiety    Arthritis    Diabetes (HCC)    GERD (gastroesophageal reflux disease)    Hyperlipidemia    Hypertension    Obesity    Palpitations    Uterine fibroid     Past Surgical History:  Procedure Laterality Date   ABDOMINAL HYSTERECTOMY  1974   CARDIOVERSION N/A 05/09/2021   Procedure: CARDIOVERSION;  Surgeon: Thurmon Fair, MD;  Location: [facility];  Service: Cardiovascular;  Laterality: N/A;   COLONOSCOPY     TOTAL KNEE ARTHROPLASTY Left 2006   UMBILICAL HERNIA REPAIR  2000    Current Outpatient Medications  Medication Sig Dispense Refill   albuterol (VENTOLIN HFA) 108 (90 Base) MCG/ACT inhaler TAKE 2 PUFFS BY MOUTH EVERY 6 HOURS AS NEEDED FOR WHEEZE OR SHORTNESS OF BREATH 6.7 each 2   ALPRAZolam (XANAX) 0.5 MG tablet Take 0.5 mg by mouth at bedtime as needed for anxiety.     amLODipine (NORVASC) 5 MG tablet Take 5 mg by mouth daily.     apixaban (ELIQUIS) 5 MG TABS tablet Take 1 tablet (5 mg total) by mouth 2 (two) times daily. 180 tablet 1   Carboxymethylcellul-Glycerin (LUBRICATING EYE DROPS OP) Place 1  drop into both eyes daily as needed (dry eyes).     Cholecalciferol (VITAMIN D3) 2000 UNITS TABS Take 2,000 Units by mouth daily.     diltiazem (CARDIZEM CD) 240 MG 24 hr capsule TAKE 1 CAPSULE BY MOUTH EVERY DAY 90 capsule 0   escitalopram (LEXAPRO) 10 MG tablet Take 10 mg by mouth daily. (Patient not taking: Reported on 08/13/2022)     flecainide (TAMBOCOR) 50 MG tablet Take 1.5 tablets (75 mg total) by mouth 2 (two) times daily. 270 tablet 0   furosemide (LASIX) 20 MG tablet TAKE 1 TABLET BY MOUTH DAILY AS NEEDED (SWELLING/WEIGHT GAIN). (Patient not taking: Reported on 08/13/2022) 30 tablet 1   furosemide (LASIX) 20 MG tablet Take 1 tablet (20 mg total) by mouth daily. (Patient not taking: Reported on 08/13/2022) 7 tablet 0   KLOR-CON M20 20 MEQ tablet TAKE 1 TABLET (20 MEQ TOTAL) BY MOUTH DAILY AS NEEDED (WHEN LASIX IS TAKEN). (Patient not taking: Reported on 08/13/2022) 30 tablet 1   lisinopril-hydrochlorothiazide (ZESTORETIC) 20-25 MG tablet Take 1 tablet by mouth daily.     mirtazapine (REMERON) 15 MG tablet Take 15 mg by mouth at bedtime. (Patient not taking: Reported on 08/13/2022)     Multiple Vitamins-Minerals (CENTRUM SILVER PO) Take 1 tablet by mouth daily.     Omega-3 Fatty Acids (FISH OIL OMEGA-3 PO) Take 1 tablet by mouth every morning.     omeprazole (PRILOSEC  OTC) 20 MG tablet Take 20 mg by mouth daily.     pravastatin (PRAVACHOL) 20 MG tablet Take 20 mg by mouth daily.     pyridOXINE (VITAMIN B-6) 100 MG tablet Take 100 mg by mouth daily.     terbinafine (LAMISIL) 250 MG tablet Take 250 mg by mouth daily. (Patient not taking: Reported on 08/13/2022)     traZODone (DESYREL) 50 MG tablet Take 50 mg by mouth at bedtime.     Vibegron (GEMTESA) 75 MG TABS Take 1 tablet (75 mg total) by mouth daily.     vitamin B-12 (CYANOCOBALAMIN) 500 MCG tablet Take 500 mcg by mouth daily.     No current facility-administered medications for this visit.    Allergies as of 05/29/2023   (No Known  Allergies)    Family History  Problem Relation Age of Onset   Heart disease Father    Alzheimer's disease Mother    Colon polyps Sister    Breast cancer Sister    Emphysema Sister        smoker   Emphysema Brother        smoker   Cancer Maternal Grandmother        type unknown    Social History   Socioeconomic History   Marital status: Married    Spouse name: Not on file   Number of children: 1   Years of education: Not on file   Highest education level: Not on file  Occupational History   Occupation: retired Lawyer  Tobacco Use   Smoking status: Never   Smokeless tobacco: Never  Vaping Use   Vaping status: Never Used  Substance and Sexual Activity   Alcohol use: No   Drug use: No   Sexual activity: Not on file  Other Topics Concern   Not on file  Social History Narrative   Not on file   Social Drivers of Health   Financial Resource Strain: Not on file  Food Insecurity: Not on file  Transportation Needs: Not on file  Physical Activity: Not on file  Stress: Not on file  Social Connections: Not on file  Intimate Partner Violence: Not on file    Review of Systems:    Constitutional: No weight loss, fever, chills, weakness or fatigue HEENT: Eyes: No change in vision               Ears, Nose, Throat:  No change in hearing or congestion Skin: No rash or itching Cardiovascular: No chest pain, chest pressure or palpitations   Respiratory: No SOB or cough Gastrointestinal: See HPI and otherwise negative Genitourinary: No dysuria or change in urinary frequency Neurological: No headache, dizziness or syncope Musculoskeletal: No new muscle or joint pain Hematologic: No bleeding or bruising Psychiatric: No history of depression or anxiety    Physical Exam:  Vital signs: There were no vitals taken for this visit.  Constitutional: NAD, Well developed, Well nourished, alert and cooperative Head:  Normocephalic and atraumatic. Eyes:   PEERL, EOMI. No icterus.  Conjunctiva pink. Respiratory: Respirations even and unlabored. Lungs clear to auscultation bilaterally.   No wheezes, crackles, or rhonchi.  Cardiovascular:  Regular rate and rhythm. No peripheral edema, cyanosis or pallor.  Gastrointestinal:  Soft, nondistended, nontender. No rebound or guarding. Normal bowel sounds. No appreciable masses or hepatomegaly. Rectal:  Not performed.  Msk:  Symmetrical without gross deformities. Without edema, no deformity or joint abnormality.  Neurologic:  Alert and  oriented x4;  grossly normal neurologically.  Skin:   Dry and intact without significant lesions or rashes. Psychiatric: Oriented to person, place and time. Demonstrates good judgement and reason without abnormal affect or behaviors.  Physical Exam    RELEVANT LABS AND IMAGING: CBC    Component Value Date/Time   WBC 11.1 (H) 07/05/2022 0927   RBC 5.28 (H) 07/05/2022 0927   HGB 14.2 07/05/2022 0927   HGB 13.6 05/03/2021 0827   HCT 44.3 07/05/2022 0927   HCT 40.5 05/03/2021 0827   PLT 295 07/05/2022 0927   PLT 276 05/03/2021 0827   MCV 83.9 07/05/2022 0927   MCV 78 (L) 05/03/2021 0827   MCH 26.9 07/05/2022 0927   MCHC 32.1 07/05/2022 0927   RDW 16.0 (H) 07/05/2022 0927   RDW 17.1 (H) 05/03/2021 0827   LYMPHSABS 1.8 05/03/2021 0827   MONOABS 0.6 04/05/2009 0854   EOSABS 0.1 05/03/2021 0827   BASOSABS 0.1 05/03/2021 0827    CMP     Component Value Date/Time   NA 136 07/05/2022 0927   NA 138 05/03/2021 0827   K 3.9 07/05/2022 0927   CL 99 07/05/2022 0927   CO2 28 07/05/2022 0927   GLUCOSE 108 (H) 07/05/2022 0927   BUN 7 (L) 07/05/2022 0927   BUN 13 05/03/2021 0827   CREATININE 0.79 07/05/2022 0927   CALCIUM 9.7 07/05/2022 0927   PROT 7.5 07/05/2022 0920   ALBUMIN 4.0 07/05/2022 0920   AST 27 07/05/2022 0920   ALT 20 07/05/2022 0920   ALKPHOS 93 07/05/2022 0920   BILITOT 0.4 07/05/2022 0920   GFRNONAA >60 07/05/2022 0927   GFRAA  04/05/2009 0854    >60        The eGFR  has been calculated using the MDRD equation. This calculation has not been validated in all clinical situations. eGFR's persistently <60 mL/min signify possible Chronic Kidney Disease.     Assessment/Plan:   Assessment and Plan Assessment & Plan        Lara Mulch [facility] 05/28/2023, 12:34 PM  Cc: Assunta Found, MD

## 2023-05-29 ENCOUNTER — Ambulatory Visit (INDEPENDENT_AMBULATORY_CARE_PROVIDER_SITE_OTHER): Payer: Medicare Other | Admitting: Gastroenterology

## 2023-05-29 ENCOUNTER — Encounter: Payer: Self-pay | Admitting: Gastroenterology

## 2023-05-29 ENCOUNTER — Telehealth: Payer: Self-pay

## 2023-05-29 VITALS — BP 114/72 | HR 65 | Ht 65.0 in | Wt 252.1 lb

## 2023-05-29 DIAGNOSIS — Z8719 Personal history of other diseases of the digestive system: Secondary | ICD-10-CM

## 2023-05-29 DIAGNOSIS — I4891 Unspecified atrial fibrillation: Secondary | ICD-10-CM

## 2023-05-29 DIAGNOSIS — Z1211 Encounter for screening for malignant neoplasm of colon: Secondary | ICD-10-CM

## 2023-05-29 DIAGNOSIS — I4819 Other persistent atrial fibrillation: Secondary | ICD-10-CM

## 2023-05-29 MED ORDER — NA SULFATE-K SULFATE-MG SULF 17.5-3.13-1.6 GM/177ML PO SOLN: ORAL | 0 refills | Status: DC

## 2023-05-29 NOTE — Telephone Encounter (Signed)
 [facility] Pre-operative Risk Assessment     Request for surgical clearance:     Endoscopy Procedure  What type of surgery is being performed?     Colonoscopy  When is this surgery scheduled?     07/17/23  What type of clearance is required ?   Pharmacy  Are there any medications that need to be held prior to surgery and how long? Eliquis and 2 days  Practice name and name of physician performing surgery?      [facility]  What is your office phone and fax number?      Phone- [phone]  Fax- [phone]  Anesthesia type (None, local, MAC, general) ?       MAC   Please route your response to  Giannina Bartolome, CMA

## 2023-05-29 NOTE — Patient Instructions (Signed)
 You have been scheduled for a colonoscopy. Please follow written instructions given to you at your visit today.   If you use inhalers (even only as needed), please bring them with you on the day of your procedure. ____________________________________________________________________  Due to recent changes in healthcare laws, you may see the results of your imaging and laboratory studies on MyChart before your provider has had a chance to review them.  We understand that in some cases there may be results that are confusing or concerning to you. Not all laboratory results come back in the same time frame and the provider may be waiting for multiple results in order to interpret others.  Please give [place] 48 hours in order for your provider to thoroughly review all the results before contacting the office for clarification of your results.   Thank you for trusting me with your gastrointestinal care!   Boone Master, PA

## 2023-05-29 NOTE — Progress Notes (Signed)
 Noted.

## 2023-05-30 NOTE — Telephone Encounter (Signed)
 Patient with diagnosis of atrial fibrillation on Eliquis for anticoagulation.    What type of surgery is being performed?     Colonoscopy  When is this surgery scheduled?     07/17/23    CHA2DS2-VASc Score = 4   This indicates a 4.8% annual risk of stroke. The patient's score is based upon: CHF History: 0 HTN History: 1 Diabetes History: 1 Stroke History: 0 Vascular Disease History: 0 Age Score: 1 Gender Score: 1    CrCl 119 Platelet count 295  Per office protocol, patient can hold Eliquis for 2 days prior to procedure.   Patient will not need bridging with Lovenox (enoxaparin) around procedure.  **This guidance is not considered finalized until pre-operative APP has relayed final recommendations.**

## 2023-05-30 NOTE — Telephone Encounter (Signed)
   Patient Name: Emily Andersen  DOB: 08/30/1955 MRN: [id number]  Primary Cardiologist: Charlton Haws, MD  Clinical pharmacists have reviewed the patient's past medical history, labs, and current medications as part of preoperative protocol coverage. The following recommendations have been made:   Per office protocol, patient can hold Eliquis for 2 days prior to procedure.   Patient will not need bridging with Lovenox (enoxaparin) around procedure.   I will route this recommendation to the requesting party via Epic fax function and remove from pre-op pool.  Please call with questions.  Napoleon Form, Leodis Rains, NP 05/30/2023, 2:48 PM

## 2023-05-30 NOTE — Telephone Encounter (Signed)
 Contacted patient and patient is aware to hold Eliquis 2 days prior to her procedure per cardiology.

## 2023-06-14 NOTE — Telephone Encounter (Signed)
 Patient assistance form copy has been discarded since patient hasn't picked it from our office.

## 2023-06-17 DIAGNOSIS — I10 Essential (primary) hypertension: Secondary | ICD-10-CM | POA: Diagnosis not present

## 2023-06-17 DIAGNOSIS — D518 Other vitamin B12 deficiency anemias: Secondary | ICD-10-CM | POA: Diagnosis not present

## 2023-06-17 DIAGNOSIS — Z9229 Personal history of other drug therapy: Secondary | ICD-10-CM | POA: Diagnosis not present

## 2023-06-17 DIAGNOSIS — Z0001 Encounter for general adult medical examination with abnormal findings: Secondary | ICD-10-CM | POA: Diagnosis not present

## 2023-06-17 DIAGNOSIS — M159 Polyosteoarthritis, unspecified: Secondary | ICD-10-CM | POA: Diagnosis not present

## 2023-06-17 DIAGNOSIS — E559 Vitamin D deficiency, unspecified: Secondary | ICD-10-CM | POA: Diagnosis not present

## 2023-06-17 DIAGNOSIS — E119 Type 2 diabetes mellitus without complications: Secondary | ICD-10-CM | POA: Diagnosis not present

## 2023-06-17 DIAGNOSIS — I4891 Unspecified atrial fibrillation: Secondary | ICD-10-CM | POA: Diagnosis not present

## 2023-07-02 ENCOUNTER — Ambulatory Visit: Admitting: Urology

## 2023-07-03 ENCOUNTER — Ambulatory Visit: Admitting: Urology

## 2023-07-04 ENCOUNTER — Encounter: Payer: Self-pay | Admitting: Internal Medicine

## 2023-07-11 DIAGNOSIS — N3281 Overactive bladder: Secondary | ICD-10-CM | POA: Insufficient documentation

## 2023-07-11 DIAGNOSIS — R351 Nocturia: Secondary | ICD-10-CM | POA: Insufficient documentation

## 2023-07-11 DIAGNOSIS — N3941 Urge incontinence: Secondary | ICD-10-CM | POA: Insufficient documentation

## 2023-07-11 NOTE — Progress Notes (Signed)
 Name: Emily Andersen DOB: May 18, 1955 MRN: [id number]  History of Present Illness: Emily Andersen is a 68 y.o. female who presents today for follow up visit at [facility].  GU History includes: 1. OAB with urinary nocturia, urgency, and urge incontinence.  At initial visit on 05/08/2023: - Discussed OAB. Exacerbating factors: caffeine intake, water intake before bed, BLE edema. - Question possible OSA with nocturnal polyuria; may advise sleep study if symptoms persist.  - Advised the following: 1. No caffeine intake 6 hours before bedtime. 2. Minimal fluid intake 3 hours before bedtime. 3. Gemtesa  (Vibegron ) 75 mg daily. 4. Return in about 8 weeks (around 07/03/2023) for UA, PVR, & f/u with Griselda Lederer NP.  Today: She reports symptomatic improvement since starting Gemtesa  (Vibegron ) 75 mg daily. She states the she is no longer leaking at all; wearing a thin pad daily "just in case". She is no longer having significant urinary urgency and reports that her nocturia has decreased from 4x/night to 1x/night. Reports somewhat slower urinary stream but denies hesitancy, straining to void, or sensations of incomplete emptying. She denies dysuria or gross hematuria.   Medications: Current Outpatient Medications  Medication Sig Dispense Refill   amLODipine (NORVASC) 5 MG tablet Take 5 mg by mouth daily.     apixaban  (ELIQUIS ) 5 MG TABS tablet Take 1 tablet (5 mg total) by mouth 2 (two) times daily. 180 tablet 1   Carboxymethylcellul-Glycerin (LUBRICATING EYE DROPS OP) Place 1 drop into both eyes daily as needed (dry eyes).     Cholecalciferol (VITAMIN D3) 2000 UNITS TABS Take 2,000 Units by mouth daily.     diltiazem  (CARDIZEM  CD) 240 MG 24 hr capsule TAKE 1 CAPSULE BY MOUTH EVERY DAY 90 capsule 0   flecainide  (TAMBOCOR ) 50 MG tablet Take 1.5 tablets (75 mg total) by mouth 2 (two) times daily. 270 tablet 0   lisinopril-hydrochlorothiazide (ZESTORETIC) 20-25 MG tablet  Take 1 tablet by mouth daily.     Multiple Vitamins-Minerals (CENTRUM SILVER PO) Take 1 tablet by mouth daily.     Na Sulfate-K Sulfate-Mg Sulfate concentrate (SUPREP) 17.5-3.13-1.6 GM/177ML SOLN Use as directed; may use generic; goodrx card if insurance will not cover generic 354 mL 0   Omega-3 Fatty Acids (FISH OIL OMEGA-3 PO) Take 1 tablet by mouth every morning.     omeprazole (PRILOSEC OTC) 20 MG tablet Take 20 mg by mouth daily.     pravastatin (PRAVACHOL) 20 MG tablet Take 20 mg by mouth daily.     pyridOXINE (VITAMIN B-6) 100 MG tablet Take 100 mg by mouth daily.     Vibegron  (GEMTESA ) 75 MG TABS Take 1 tablet (75 mg total) by mouth daily. 30 tablet 11   vitamin B-12 (CYANOCOBALAMIN ) 500 MCG tablet Take 500 mcg by mouth daily.     albuterol  (VENTOLIN  HFA) 108 (90 Base) MCG/ACT inhaler TAKE 2 PUFFS BY MOUTH EVERY 6 HOURS AS NEEDED FOR WHEEZE OR SHORTNESS OF BREATH 6.7 each 2   traZODone (DESYREL) 50 MG tablet Take 50 mg by mouth at bedtime.     Vibegron  (GEMTESA ) 75 MG TABS Take 1 tablet (75 mg total) by mouth daily.     No current facility-administered medications for this visit.    Allergies: No Known Allergies  Past Medical History:  Diagnosis Date   Anemia    Anxiety    Arthritis    Atrial fibrillation (HCC)    Diabetes (HCC)    GERD (gastroesophageal reflux disease)    Hyperlipidemia  Hypertension    Obesity    Palpitations    Uterine fibroid    Past Surgical History:  Procedure Laterality Date   ABDOMINAL HYSTERECTOMY  1974   CARDIOVERSION N/A 05/09/2021   Procedure: CARDIOVERSION;  Surgeon: Luana Rumple, MD;  Location: [facility];  Service: Cardiovascular;  Laterality: N/A;   COLONOSCOPY     TOTAL KNEE ARTHROPLASTY Left 2006   UMBILICAL HERNIA REPAIR  2000   Family History  Problem Relation Age of Onset   Heart disease Father    Alzheimer's disease Mother    Colon polyps Sister    Breast cancer Sister    Emphysema Sister        smoker   Emphysema  Brother        smoker   Cancer Maternal Grandmother        type unknown   Social History   Socioeconomic History   Marital status: Married    Spouse name: Not on file   Number of children: 1   Years of education: Not on file   Highest education level: Not on file  Occupational History   Occupation: retired Lawyer  Tobacco Use   Smoking status: Never   Smokeless tobacco: Never  Vaping Use   Vaping status: Never Used  Substance and Sexual Activity   Alcohol use: No   Drug use: No   Sexual activity: Not on file  Other Topics Concern   Not on file  Social History Narrative   Not on file   Social Drivers of Health   Financial Resource Strain: Not on file  Food Insecurity: Not on file  Transportation Needs: Not on file  Physical Activity: Not on file  Stress: Not on file  Social Connections: Not on file  Intimate Partner Violence: Not on file    Review of Systems Constitutional: Patient denies any unintentional weight loss or change in strength lntegumentary: Patient denies any rashes or pruritus Cardiovascular: Patient denies chest pain or syncope Respiratory: Patient denies shortness of breath Gastrointestinal: Patient denies constipation or diarrhea Musculoskeletal: Patient denies muscle cramps or weakness Neurologic: Patient denies convulsions or seizures Allergic/Immunologic: Patient denies recent allergic reaction(s) Hematologic/Lymphatic: Patient denies bleeding tendencies Endocrine: Patient denies heat/cold intolerance  GU: As per HPI.  OBJECTIVE Vitals:   07/12/23 1315  BP: (!) 146/84  Pulse: 65   There is no height or weight on file to calculate BMI.  Physical Examination Constitutional: No obvious distress; patient is non-toxic appearing  Cardiovascular: No visible lower extremity edema.  Respiratory: The patient does not have audible wheezing/stridor; respirations do not appear labored  Gastrointestinal: Abdomen non-distended Musculoskeletal:  Normal ROM of UEs  Skin: No obvious rashes/open sores  Neurologic: CN 2-12 grossly intact Psychiatric: Answered questions appropriately with normal affect  Hematologic/Lymphatic/Immunologic: No obvious bruises or sites of spontaneous bleeding  Urine microscopy: 6-10 WBC/hpf, 0-2 RBC/hpf, few bacteria PVR: 19 ml  ASSESSMENT OAB (overactive bladder) - Plan: Urinalysis, Routine w reflex microscopic, BLADDER SCAN AMB NON-IMAGING, Vibegron  (GEMTESA ) 75 MG TABS, Vibegron  (GEMTESA ) 75 MG TABS  Nocturia - Plan: Urinalysis, Routine w reflex microscopic, BLADDER SCAN AMB NON-IMAGING, Vibegron  (GEMTESA ) 75 MG TABS, Vibegron  (GEMTESA ) 75 MG TABS  Urge incontinence - Plan: Urinalysis, Routine w reflex microscopic, BLADDER SCAN AMB NON-IMAGING, Vibegron  (GEMTESA ) 75 MG TABS, Vibegron  (GEMTESA ) 75 MG TABS  She is doing well today. Her overactive bladder (OAB) symptoms are much improved. Will plan to continue Gemtesa  (Vibegron ) 75 mg daily and behavioral modifications. Discussed double voiding as needed.  Will plan for follow up in 6 months or sooner if needed. Pt verbalized understanding and agreement. All questions were answered.  PLAN Advised the following: 1. Continue Gemtesa  (Vibegron ) 75 mg daily. 2. No caffeine intake 6 hours before bedtime. 3. Minimal fluid intake 3 hours before bedtime. 4. Double voiding PRN. 5. Return in about 6 months (around 01/12/2024) for UA, PVR, & f/u with Griselda Lederer NP.  Orders Placed This Encounter  Procedures   Urinalysis, Routine w reflex microscopic   BLADDER SCAN AMB NON-IMAGING    It has been explained that the patient is to follow regularly with their PCP in addition to all other providers involved in their care and to follow instructions provided by these respective offices. Patient advised to contact urology clinic if any urologic-pertaining questions, concerns, new symptoms or problems arise in the interim period.  There are no Patient Instructions on  file for this visit.  Electronically signed by:  Lauretta Ponto, FNP   07/12/23    1:53 PM

## 2023-07-12 ENCOUNTER — Telehealth: Payer: Self-pay

## 2023-07-12 ENCOUNTER — Ambulatory Visit: Admitting: Urology

## 2023-07-12 ENCOUNTER — Encounter: Payer: Self-pay | Admitting: Urology

## 2023-07-12 VITALS — BP 146/84 | HR 65

## 2023-07-12 DIAGNOSIS — N3941 Urge incontinence: Secondary | ICD-10-CM

## 2023-07-12 DIAGNOSIS — R351 Nocturia: Secondary | ICD-10-CM | POA: Diagnosis not present

## 2023-07-12 DIAGNOSIS — N3281 Overactive bladder: Secondary | ICD-10-CM | POA: Diagnosis not present

## 2023-07-12 LAB — URINALYSIS, ROUTINE W REFLEX MICROSCOPIC
Bilirubin, UA: NEGATIVE
Glucose, UA: NEGATIVE
Ketones, UA: NEGATIVE
Nitrite, UA: NEGATIVE
Protein,UA: NEGATIVE
RBC, UA: NEGATIVE
Specific Gravity, UA: 1.015 (ref 1.005–1.030)
Urobilinogen, Ur: 0.2 mg/dL (ref 0.2–1.0)
pH, UA: 8.5 — ABNORMAL HIGH (ref 5.0–7.5)

## 2023-07-12 LAB — MICROSCOPIC EXAMINATION

## 2023-07-12 LAB — BLADDER SCAN AMB NON-IMAGING: Scan Result: 19

## 2023-07-12 MED ORDER — GEMTESA 75 MG PO TABS
1.0000 | ORAL_TABLET | Freq: Every day | ORAL | Status: DC
Start: 2023-07-12 — End: 2023-07-17

## 2023-07-12 MED ORDER — GEMTESA 75 MG PO TABS
1.0000 | ORAL_TABLET | Freq: Every day | ORAL | 11 refills | Status: DC
Start: 2023-07-12 — End: 2023-08-28

## 2023-07-12 NOTE — Telephone Encounter (Signed)
 Spoke to patient and reminded her to hold her Eliquis  for 2 days prior to her procedure.  Patient agreed.

## 2023-07-12 NOTE — Telephone Encounter (Signed)
-----   Message from [facility] [name] sent at 05/30/2023  2:54 PM EDT ----- Regarding: Emily Andersen  hold Call and remind patient to hold Eliquisn 2 days prior to her procedure staring on the 26th

## 2023-07-16 ENCOUNTER — Telehealth: Payer: Self-pay | Admitting: Urology

## 2023-07-16 NOTE — Telephone Encounter (Signed)
 Says Gemtesa  is over $200 would like an alternative or samples

## 2023-07-17 ENCOUNTER — Encounter: Payer: Self-pay | Admitting: Internal Medicine

## 2023-07-17 ENCOUNTER — Ambulatory Visit: Admitting: Internal Medicine

## 2023-07-17 VITALS — BP 117/51 | HR 52 | Temp 97.2°F | Resp 13

## 2023-07-17 DIAGNOSIS — Z1211 Encounter for screening for malignant neoplasm of colon: Secondary | ICD-10-CM

## 2023-07-17 DIAGNOSIS — K5730 Diverticulosis of large intestine without perforation or abscess without bleeding: Secondary | ICD-10-CM

## 2023-07-17 DIAGNOSIS — I10 Essential (primary) hypertension: Secondary | ICD-10-CM | POA: Diagnosis not present

## 2023-07-17 DIAGNOSIS — D123 Benign neoplasm of transverse colon: Secondary | ICD-10-CM | POA: Diagnosis not present

## 2023-07-17 DIAGNOSIS — E785 Hyperlipidemia, unspecified: Secondary | ICD-10-CM | POA: Diagnosis not present

## 2023-07-17 MED ORDER — SODIUM CHLORIDE 0.9 % IV SOLN
500.0000 mL | INTRAVENOUS | Status: DC
Start: 2023-07-17 — End: 2023-07-17

## 2023-07-17 NOTE — Progress Notes (Signed)
 Sedate, gd SR, tolerated procedure well, VSS, report to RN

## 2023-07-17 NOTE — Progress Notes (Signed)
 Expand All Collapse All    Chief Complaint: Colonoscopy recall Primary GI MD: Dr. Elvin Hammer   HPI: 68 year old female with history of diabetes, anxiety, hypertension, PAF (Eliquis ), and others as listed below presents for evaluation of colonoscopy recall   Last seen 2021 by Dr. Elvin Hammer for GERD and intermittent solid food dysphagia. Was well controlled on omeprazole 20mg  once daily.   Patient states she is here for her colonoscopy recall.  Denies any GI issues such as change in bowel habits, weight loss, nausea, he denies family history of colon cancer.  Doing well.  No recurrent dysphagia or GERD at this time   PREVIOUS GI WORKUP    EGD 08/2019 for dysphagia - Normal esophagus. Dilated at 54Fr - Normal stomach, save incidental benign fundic gland type polyps. Small hiatal hernia.  - Normal examined duodenum.  - No specimens collected.   Colonoscopy 10/2012 -- mild diverticulosis -- otherwise normal colonoscopy -- repeat 10 years (10/2022)       Past Medical History:  Diagnosis Date   Anemia     Anxiety     Arthritis     Diabetes (HCC)     GERD (gastroesophageal reflux disease)     Hyperlipidemia     Hypertension     Obesity     Palpitations     Uterine fibroid                 Past Surgical History:  Procedure Laterality Date   ABDOMINAL HYSTERECTOMY   1974   CARDIOVERSION N/A 05/09/2021    Procedure: CARDIOVERSION;  Surgeon: Luana Rumple, MD;  Location: [facility];  Service: Cardiovascular;  Laterality: N/A;   COLONOSCOPY       TOTAL KNEE ARTHROPLASTY Left 2006   UMBILICAL HERNIA REPAIR   2000                Current Outpatient Medications  Medication Sig Dispense Refill   albuterol  (VENTOLIN  HFA) 108 (90 Base) MCG/ACT inhaler TAKE 2 PUFFS BY MOUTH EVERY 6 HOURS AS NEEDED FOR WHEEZE OR SHORTNESS OF BREATH 6.7 each 2   ALPRAZolam (XANAX) 0.5 MG tablet Take 0.5 mg by mouth at bedtime as needed for anxiety.       amLODipine (NORVASC) 5 MG tablet Take 5 mg by mouth  daily.       apixaban  (ELIQUIS ) 5 MG TABS tablet Take 1 tablet (5 mg total) by mouth 2 (two) times daily. 180 tablet 1   Carboxymethylcellul-Glycerin (LUBRICATING EYE DROPS OP) Place 1 drop into both eyes daily as needed (dry eyes).       Cholecalciferol (VITAMIN D3) 2000 UNITS TABS Take 2,000 Units by mouth daily.       diltiazem  (CARDIZEM  CD) 240 MG 24 hr capsule TAKE 1 CAPSULE BY MOUTH EVERY DAY 90 capsule 0   escitalopram (LEXAPRO) 10 MG tablet Take 10 mg by mouth daily. (Patient not taking: Reported on 08/13/2022)       flecainide  (TAMBOCOR ) 50 MG tablet Take 1.5 tablets (75 mg total) by mouth 2 (two) times daily. 270 tablet 0   furosemide  (LASIX ) 20 MG tablet TAKE 1 TABLET BY MOUTH DAILY AS NEEDED (SWELLING/WEIGHT GAIN). (Patient not taking: Reported on 08/13/2022) 30 tablet 1   furosemide  (LASIX ) 20 MG tablet Take 1 tablet (20 mg total) by mouth daily. (Patient not taking: Reported on 08/13/2022) 7 tablet 0   KLOR-CON  M20 20 MEQ tablet TAKE 1 TABLET (20 MEQ TOTAL) BY MOUTH DAILY AS NEEDED (WHEN LASIX  IS TAKEN). (Patient  not taking: Reported on 08/13/2022) 30 tablet 1   lisinopril-hydrochlorothiazide (ZESTORETIC) 20-25 MG tablet Take 1 tablet by mouth daily.       mirtazapine (REMERON) 15 MG tablet Take 15 mg by mouth at bedtime. (Patient not taking: Reported on 08/13/2022)       Multiple Vitamins-Minerals (CENTRUM SILVER PO) Take 1 tablet by mouth daily.       Omega-3 Fatty Acids (FISH OIL OMEGA-3 PO) Take 1 tablet by mouth every morning.       omeprazole (PRILOSEC OTC) 20 MG tablet Take 20 mg by mouth daily.       pravastatin (PRAVACHOL) 20 MG tablet Take 20 mg by mouth daily.       pyridOXINE (VITAMIN B-6) 100 MG tablet Take 100 mg by mouth daily.       terbinafine (LAMISIL) 250 MG tablet Take 250 mg by mouth daily. (Patient not taking: Reported on 08/13/2022)       traZODone (DESYREL) 50 MG tablet Take 50 mg by mouth at bedtime.       Vibegron  (GEMTESA ) 75 MG TABS Take 1 tablet (75 mg total)  by mouth daily.       vitamin B-12 (CYANOCOBALAMIN ) 500 MCG tablet Take 500 mcg by mouth daily.          No current facility-administered medications for this visit.           Allergies as of 05/29/2023   (No Known Allergies)           Family History  Problem Relation Age of Onset   Heart disease Father     Alzheimer's disease Mother     Colon polyps Sister     Breast cancer Sister     Emphysema Sister          smoker   Emphysema Brother          smoker   Cancer Maternal Grandmother          type unknown          Social History         Socioeconomic History   Marital status: Married      Spouse name: Not on file   Number of children: 1   Years of education: Not on file   Highest education level: Not on file  Occupational History   Occupation: retired Lawyer  Tobacco Use   Smoking status: Never   Smokeless tobacco: Never  Vaping Use   Vaping status: Never Used  Substance and Sexual Activity   Alcohol use: No   Drug use: No   Sexual activity: Not on file  Other Topics Concern   Not on file  Social History Narrative   Not on file    Social Drivers of Health    Financial Resource Strain: Not on file  Food Insecurity: Not on file  Transportation Needs: Not on file  Physical Activity: Not on file  Stress: Not on file  Social Connections: Not on file  Intimate Partner Violence: Not on file      Review of Systems:    Constitutional: No weight loss, fever, chills, weakness or fatigue HEENT: Eyes: No change in vision               Ears, Nose, Throat:  No change in hearing or congestion Skin: No rash or itching Cardiovascular: No chest pain, chest pressure or palpitations   Respiratory: No SOB or cough Gastrointestinal: See HPI and otherwise negative Genitourinary: No dysuria or  change in urinary frequency Neurological: No headache, dizziness or syncope Musculoskeletal: No new muscle or joint pain Hematologic: No bleeding or bruising Psychiatric: No  history of depression or anxiety      Physical Exam:  Vital signs: There were no vitals taken for this visit.   Constitutional: NAD, Well developed, Well nourished, alert and cooperative Head:  Normocephalic and atraumatic. Eyes:   PEERL, EOMI. No icterus. Conjunctiva pink. Respiratory: Respirations even and unlabored. Lungs clear to auscultation bilaterally.   No wheezes, crackles, or rhonchi.  Cardiovascular:  Regular rate and rhythm. No peripheral edema, cyanosis or pallor.  Rectal:  Not performed.  Msk:  Symmetrical without gross deformities. Without edema, no deformity or joint abnormality.  Neurologic:  Alert and  oriented x4;  grossly normal neurologically.  Skin:   Dry and intact without significant lesions or rashes. Psychiatric: Oriented to person, place and time. Demonstrates good judgement and reason without abnormal affect or behaviors.   RELEVANT LABS AND IMAGING: CBC Labs (Brief)          Component Value Date/Time    WBC 11.1 (H) 07/05/2022 0927    RBC 5.28 (H) 07/05/2022 0927    HGB 14.2 07/05/2022 0927    HGB 13.6 05/03/2021 0827    HCT 44.3 07/05/2022 0927    HCT 40.5 05/03/2021 0827    PLT 295 07/05/2022 0927    PLT 276 05/03/2021 0827    MCV 83.9 07/05/2022 0927    MCV 78 (L) 05/03/2021 0827    MCH 26.9 07/05/2022 0927    MCHC 32.1 07/05/2022 0927    RDW 16.0 (H) 07/05/2022 0927    RDW 17.1 (H) 05/03/2021 0827    LYMPHSABS 1.8 05/03/2021 0827    MONOABS 0.6 04/05/2009 0854    EOSABS 0.1 05/03/2021 0827    BASOSABS 0.1 05/03/2021 0827        CMP     Labs (Brief)           Component Value Date/Time    NA 136 07/05/2022 0927    NA 138 05/03/2021 0827    K 3.9 07/05/2022 0927    CL 99 07/05/2022 0927    CO2 28 07/05/2022 0927    GLUCOSE 108 (H) 07/05/2022 0927    BUN 7 (L) 07/05/2022 0927    BUN 13 05/03/2021 0827    CREATININE 0.79 07/05/2022 0927    CALCIUM 9.7 07/05/2022 0927    PROT 7.5 07/05/2022 0920    ALBUMIN 4.0 07/05/2022 0920     AST 27 07/05/2022 0920    ALT 20 07/05/2022 0920    ALKPHOS 93 07/05/2022 0920    BILITOT 0.4 07/05/2022 0920    GFRNONAA >60 07/05/2022 0927    GFRAA   04/05/2009 0854      >60        The eGFR has been calculated using the MDRD equation. This calculation has not been validated in all clinical situations. eGFR's persistently <60 mL/min signify possible Chronic Kidney Disease.          Assessment/Plan:    Screening for colon cancer Last colonoscopy in 2014 showing diverticulosis, no polyps. Repeat 10 years. No GI issues today -- schedule colonoscopy with Dr. Elvin Hammer -- Hold Eliquis  x 2 days -- I thoroughly discussed the procedure with the patient (at bedside) to include nature of the procedure, alternatives, benefits, and risks (including but not limited to bleeding, infection, perforation, anesthesia/cardiac pulmonary complications).  Patient verbalized understanding and gave verbal consent to proceed with  procedure.    AFIB on Eliquis  -- Will hold Eliquis   2 days prior to endoscopic procedures - will instruct when and how to resume after procedure. Benefits and risks of procedure explained including risks of bleeding, perforation, infection, missed lesions, reactions to medications and possible need for hospitalization and surgery for complications. Additional rare but real risk of stroke or other vascular clotting events off Eliquis  also explained and need to seek urgent help if any signs of these problems occur. Will communicate by phone or EMR with patient's  prescribing provider to confirm that holding Eliquis  is reasonable in this case.       Em[facility] 05/28/2023, 12:34 PM   Cc: Minus Amel, MD  Recent H&P as above.  No interval change.  Now for colonoscopy

## 2023-07-17 NOTE — Progress Notes (Signed)
 All VS and assessments before 12:30 do not apply to this patient

## 2023-07-17 NOTE — Telephone Encounter (Signed)
 Patient is made aware samples are up front via voiced message.

## 2023-07-17 NOTE — Patient Instructions (Addendum)
-  Handout on polyps and diverticulosis provided. -await pathology results. -repeat colonoscopy for surveillance not recommended -Continue present medications -Resume Eliquis  (apaxiban) today at prior dose   YOU HAD AN ENDOSCOPIC PROCEDURE TODAY AT THE [facility]:   Refer to the procedure report that was given to you for any specific questions about what was found during the examination.  If the procedure report does not answer your questions, please call your gastroenterologist to clarify.  If you requested that your care partner not be given the details of your procedure findings, then the procedure report has been included in a sealed envelope for you to review at your convenience later.  YOU SHOULD EXPECT: Some feelings of bloating in the abdomen. Passage of more gas than usual.  Walking can help get rid of the air that was put into your GI tract during the procedure and reduce the bloating. If you had a lower endoscopy (such as a colonoscopy or flexible sigmoidoscopy) you may notice spotting of blood in your stool or on the toilet paper. If you underwent a bowel prep for your procedure, you may not have a normal bowel movement for a few days.  Please Note:  You might notice some irritation and congestion in your nose or some drainage.  This is from the oxygen used during your procedure.  There is no need for concern and it should clear up in a day or so.  SYMPTOMS TO REPORT IMMEDIATELY:  Following lower endoscopy (colonoscopy or flexible sigmoidoscopy):  Excessive amounts of blood in the stool  Significant tenderness or worsening of abdominal pains  Swelling of the abdomen that is new, acute  Fever of 100F or higher  For urgent or emergent issues, a gastroenterologist can be reached at any hour by calling [phone]. Do not use MyChart messaging for urgent concerns.    DIET:  We do recommend a small meal at first, but then you may proceed to your regular diet.  Drink  plenty of fluids but you should avoid alcoholic beverages for 24 hours.  ACTIVITY:  You should plan to take it easy for the rest of today and you should NOT DRIVE or use heavy machinery until tomorrow (because of the sedation medicines used during the test).    FOLLOW UP: Our staff will call the number listed on your records the next business day following your procedure.  We will call around 7:15- 8:00 am to check on you and address any questions or concerns that you may have regarding the information given to you following your procedure. If we do not reach you, we will leave a message.     If any biopsies were taken you will be contacted by phone or by letter within the next 1-3 weeks.  Please call us  at [phone] if you have not heard about the biopsies in 3 weeks.    SIGNATURES/CONFIDENTIALITY: You and/or your care partner have signed paperwork which will be entered into your electronic medical record.  These signatures attest to the fact that that the information above on your After Visit Summary has been reviewed and is understood.  Full responsibility of the confidentiality of this discharge information lies with you and/or your care-partner.

## 2023-07-17 NOTE — Op Note (Signed)
 [facility] Patient Name: Emily Andersen Procedure Date: 07/17/2023 1:13 PM MRN: [id number] Endoscopist: Murel Arlington. Elvin Hammer , MD, [id number] Age: 68 Referring MD:  Date of Birth: 25-Dec-1955 Gender: Female Account #: [id number] Procedure:                Colonoscopy with cold snare polypectomy x 1 Indications:              Screening for colorectal malignant neoplasm.                            Previous examinations 2004 and 2014 were negative                            for neoplasia. Medicines:                Monitored Anesthesia Care Procedure:                Pre-Anesthesia Assessment:                           - Prior to the procedure, a History and Physical                            was performed, and patient medications and                            allergies were reviewed. The patient's tolerance of                            previous anesthesia was also reviewed. The risks                            and benefits of the procedure and the sedation                            options and risks were discussed with the patient.                            All questions were answered, and informed consent                            was obtained. Prior Anticoagulants: The patient has                            taken Eliquis  (apixaban ), last dose was 3 days                            prior to procedure. ASA Grade Assessment: III - A                            patient with severe systemic disease. After                            reviewing the risks and benefits, the patient was  deemed in satisfactory condition to undergo the                            procedure.                           After obtaining informed consent, the colonoscope                            was passed under direct vision. Throughout the                            procedure, the patient's blood pressure, pulse, and                            oxygen saturations were monitored  continuously. The                            Olympus Scope [id number] was introduced through the                            anus and advanced to the the cecum, identified by                            appendiceal orifice and ileocecal valve. The                            ileocecal valve, appendiceal orifice, and rectum                            were photographed. The quality of the bowel                            preparation was excellent. The colonoscopy was                            performed without difficulty. The patient tolerated                            the procedure well. The bowel preparation used was                            SUPREP via split dose instruction. Scope In: 1:27:22 PM Scope Out: 1:42:14 PM Scope Withdrawal Time: 0 hours 11 minutes 27 seconds  Total Procedure Duration: 0 hours 14 minutes 52 seconds  Findings:                 A 2 mm polyp was found in the transverse colon. The                            polyp was removed with a cold snare. Resection and                            retrieval were complete.  Multiple diverticula were found in the sigmoid                            colon.                           The exam was otherwise without abnormality on                            direct and retroflexion views. Complications:            No immediate complications. Estimated blood loss:                            None. Estimated Blood Loss:     Estimated blood loss: none. Impression:               - One 2 mm polyp in the transverse colon, removed                            with a cold snare. Resected and retrieved.                           - Diverticulosis in the sigmoid colon.                           - The examination was otherwise normal on direct                            and retroflexion views. Recommendation:           - Repeat colonoscopy is not recommended for                            surveillance.                            - Resume Eliquis  (apixaban ) today at prior dose.                           - Patient has a contact number available for                            emergencies. The signs and symptoms of potential                            delayed complications were discussed with the                            patient. Return to normal activities tomorrow.                            Written discharge instructions were provided to the                            patient.                           -  Resume previous diet.                           - Continue present medications.                           - Await pathology results. Murel Arlington. Elvin Hammer, MD 07/17/2023 1:46:54 PM This report has been signed electronically.

## 2023-07-17 NOTE — Progress Notes (Signed)
 Called to room to assist during endoscopic procedure.  Patient ID and intended procedure confirmed with present staff. Received instructions for my participation in the procedure from the performing physician.

## 2023-07-18 ENCOUNTER — Telehealth: Payer: Self-pay

## 2023-07-18 NOTE — Telephone Encounter (Signed)
  Follow up Call-     07/17/2023   12:53 PM  Call back number  Post procedure Call Back phone  # [phone]  Permission to leave phone message Yes     Patient questions:  Do you have a fever, pain , or abdominal swelling? No. Pain Score  0 *  Have you tolerated food without any problems? Yes.    Have you been able to return to your normal activities? Yes.    Do you have any questions about your discharge instructions: Diet   No. Medications  No. Follow up visit  No.  Do you have questions or concerns about your Care? No.  Actions: * If pain score is 4 or above: No action needed, pain <4.

## 2023-07-22 DIAGNOSIS — I480 Paroxysmal atrial fibrillation: Secondary | ICD-10-CM | POA: Diagnosis not present

## 2023-07-22 DIAGNOSIS — H539 Unspecified visual disturbance: Secondary | ICD-10-CM | POA: Diagnosis not present

## 2023-07-22 DIAGNOSIS — I482 Chronic atrial fibrillation, unspecified: Secondary | ICD-10-CM | POA: Diagnosis not present

## 2023-07-22 DIAGNOSIS — I10 Essential (primary) hypertension: Secondary | ICD-10-CM | POA: Diagnosis not present

## 2023-07-23 ENCOUNTER — Ambulatory Visit: Payer: Self-pay | Admitting: Internal Medicine

## 2023-07-23 LAB — SURGICAL PATHOLOGY

## 2023-07-29 DIAGNOSIS — H43813 Vitreous degeneration, bilateral: Secondary | ICD-10-CM | POA: Diagnosis not present

## 2023-07-29 DIAGNOSIS — H43393 Other vitreous opacities, bilateral: Secondary | ICD-10-CM | POA: Diagnosis not present

## 2023-07-29 DIAGNOSIS — H35372 Puckering of macula, left eye: Secondary | ICD-10-CM | POA: Diagnosis not present

## 2023-07-29 DIAGNOSIS — E119 Type 2 diabetes mellitus without complications: Secondary | ICD-10-CM | POA: Diagnosis not present

## 2023-07-29 DIAGNOSIS — D3131 Benign neoplasm of right choroid: Secondary | ICD-10-CM | POA: Diagnosis not present

## 2023-07-29 DIAGNOSIS — H04123 Dry eye syndrome of bilateral lacrimal glands: Secondary | ICD-10-CM | POA: Diagnosis not present

## 2023-07-29 DIAGNOSIS — H31091 Other chorioretinal scars, right eye: Secondary | ICD-10-CM | POA: Diagnosis not present

## 2023-08-21 NOTE — Progress Notes (Signed)
 CARDIOLOGY CONSULT NOTE       Patient ID: Emily Andersen MRN: [id number] DOB/AGE: 1956-01-01 68 y.o.  Admit date: (Not on file) Referring Physician: Bertell Primary Physician: Marvine Rush, MD Primary Cardiologist: Delford   HPI:  68 y.o. referred by Dr Bertell for PAF First seen on 04/18/21 History of DM, Anxiety, HTN and palpitations Seen in office by primary 03/24/21 thought to be in new onset afib. Started on coumadin and atenolol Poor quality tracing from their office shows fib/flutter rate 86 bpm nonspecific ST change Started on lopressor  and eliquis  DCC on 05/09/21 with Dr Francyne Had frequent PACls and post conversion SB rates 40's Monitor with some junctional rhythm lopressor  dose decreased TTE 05/03/21 with EF 50-55% no valve dx LA 51 mm   Still on cardizem  240 mg daily and Lopressor  12.5 mg bid   Office visit 10/03/21  ERAF rate 112 She does not realized it but given post [facility] PACls suspected started on flecainide  ETT no QRS widening or arrhythmia but only went 74% predicted maximal HR 10/17/21 Converted to NSR and was in NSR when seen in afib clinic 11/01/21  ECG sinus bradycardia rate 46 no AV block   12/29/21 Still on lopressor  12.5 mg bid and cardizem  240 mg daily for AV nodal drugs Compliant with eliquis    12/29/21 Discussed SSS and stopping lopressor  given HR in 40's She will take once/day for 2 weeks then stop   She is much better now with HR mid to upper 50's No recurrence of PAF  Primary placed her on norvasc 5 mg for BP control   Needs an inhaler for asthma/allergies  Had colonoscopy with dr Abran 07/17/23 with diverticula and one small polyp removed On Gemtesa  for baldder issues.   She indicates patient assistance for eliquis  stopped Not clear why she has a months supply Cannot afford $300/month Her income has not changed just social security  ROS All other systems reviewed and negative except as noted above  Past Medical History:  Diagnosis Date   Anemia     Anxiety    Arthritis    Atrial fibrillation (HCC)    Cataract    Diabetes (HCC)    GERD (gastroesophageal reflux disease)    Hyperlipidemia    Hypertension    Obesity    Palpitations    Uterine fibroid     Family History  Problem Relation Age of Onset   Alzheimer's disease Mother    Heart disease Father    Colon polyps Sister    Breast cancer Sister    Emphysema Sister        smoker   Emphysema Brother        smoker   Cancer Maternal Grandmother        type unknown   Colon cancer Neg Hx    Esophageal cancer Neg Hx    Rectal cancer Neg Hx    Stomach cancer Neg Hx     Social History   Socioeconomic History   Marital status: Married    Spouse name: Not on file   Number of children: 1   Years of education: Not on file   Highest education level: Not on file  Occupational History   Occupation: retired Lawyer  Tobacco Use   Smoking status: Never   Smokeless tobacco: Never  Vaping Use   Vaping status: Never Used  Substance and Sexual Activity   Alcohol use: No   Drug use: No   Sexual activity: Not on file  Other Topics Concern   Not on file  Social History Narrative   Not on file   Social Drivers of Health   Financial Resource Strain: Not on file  Food Insecurity: Not on file  Transportation Needs: Not on file  Physical Activity: Not on file  Stress: Not on file  Social Connections: Not on file  Intimate Partner Violence: Not on file    Past Surgical History:  Procedure Laterality Date   ABDOMINAL HYSTERECTOMY  1974   CARDIOVERSION N/A 05/09/2021   Procedure: CARDIOVERSION;  Surgeon: Francyne Headland, MD;  Location: [facility];  Service: Cardiovascular;  Laterality: N/A;   COLONOSCOPY     TOTAL KNEE ARTHROPLASTY Left 2006   UMBILICAL HERNIA REPAIR  2000      Current Outpatient Medications:    amLODipine (NORVASC) 10 MG tablet, Take 10 mg by mouth daily., Disp: , Rfl:    amLODipine (NORVASC) 5 MG tablet, Take 5 mg by mouth daily., Disp: , Rfl:     apixaban  (ELIQUIS ) 5 MG TABS tablet, Take 1 tablet (5 mg total) by mouth 2 (two) times daily., Disp: 180 tablet, Rfl: 1   Carboxymethylcellul-Glycerin (LUBRICATING EYE DROPS OP), Place 1 drop into both eyes daily as needed (dry eyes)., Disp: , Rfl:    Cholecalciferol (VITAMIN D3) 2000 UNITS TABS, Take 2,000 Units by mouth daily., Disp: , Rfl:    diltiazem  (CARDIZEM  CD) 240 MG 24 hr capsule, TAKE 1 CAPSULE BY MOUTH EVERY DAY, Disp: 90 capsule, Rfl: 0   flecainide  (TAMBOCOR ) 50 MG tablet, Take 1.5 tablets (75 mg total) by mouth 2 (two) times daily., Disp: 270 tablet, Rfl: 0   lisinopril-hydrochlorothiazide (ZESTORETIC) 20-25 MG tablet, Take 1 tablet by mouth daily., Disp: , Rfl:    Multiple Vitamins-Minerals (CENTRUM SILVER PO), Take 1 tablet by mouth daily., Disp: , Rfl:    Omega-3 Fatty Acids (FISH OIL OMEGA-3 PO), Take 1 tablet by mouth every morning., Disp: , Rfl:    omeprazole (PRILOSEC OTC) 20 MG tablet, Take 20 mg by mouth daily., Disp: , Rfl:    pravastatin (PRAVACHOL) 20 MG tablet, Take 20 mg by mouth daily., Disp: , Rfl:    pyridOXINE (VITAMIN B-6) 100 MG tablet, Take 100 mg by mouth daily., Disp: , Rfl:    vitamin B-12 (CYANOCOBALAMIN ) 500 MCG tablet, Take 500 mcg by mouth daily., Disp: , Rfl:     Physical Exam: Blood pressure (!) 146/82, pulse 68, height 5' 5 (1.651 m), weight 248 lb 12.8 oz (112.9 kg), SpO2 98%.    Affect appropriate Overweight black female HEENT: normal Neck supple with no adenopathy JVP normal no bruits no thyromegaly Lungs clear with no wheezing and good diaphragmatic motion Heart:  S1/S2 no murmur, no rub, gallop or click PMI normal Abdomen: benighn, BS positve, no tenderness, no AAA no bruit.  No HSM or HJR Distal pulses intact with no bruits No edema Neuro non-focal Skin warm and dry No muscular weakness   Labs:   Lab Results  Component Value Date   WBC 11.1 (H) 07/05/2022   HGB 14.2 07/05/2022   HCT 44.3 07/05/2022   MCV 83.9 07/05/2022    PLT 295 07/05/2022   No results for input(s): NA, K, CL, CO2, BUN, CREATININE, CALCIUM, PROT, BILITOT, ALKPHOS, ALT, AST, GLUCOSE in the last 168 hours.  Invalid input(s): LABALBU No results found for: CKTOTAL, CKMB, CKMBINDEX, TROPONINI No results found for: CHOL No results found for: HDL No results found for: LDLCALC No results found for: TRIG No  results found for: CHOLHDL No results found for: LDLDIRECT    Radiology: No results found.  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

## 2023-08-28 ENCOUNTER — Other Ambulatory Visit (HOSPITAL_COMMUNITY): Payer: Self-pay

## 2023-08-28 ENCOUNTER — Telehealth: Payer: Self-pay | Admitting: Cardiovascular Disease

## 2023-08-28 ENCOUNTER — Telehealth: Payer: Self-pay | Admitting: Pharmacy Technician

## 2023-08-28 ENCOUNTER — Ambulatory Visit: Attending: Cardiovascular Disease | Admitting: Cardiovascular Disease

## 2023-08-28 ENCOUNTER — Encounter: Payer: Self-pay | Admitting: Cardiovascular Disease

## 2023-08-28 VITALS — BP 146/82 | HR 68 | Ht 65.0 in | Wt 248.8 lb

## 2023-08-28 DIAGNOSIS — I480 Paroxysmal atrial fibrillation: Secondary | ICD-10-CM

## 2023-08-28 DIAGNOSIS — I10 Essential (primary) hypertension: Secondary | ICD-10-CM | POA: Diagnosis not present

## 2023-08-28 DIAGNOSIS — N3281 Overactive bladder: Secondary | ICD-10-CM

## 2023-08-28 DIAGNOSIS — I4819 Other persistent atrial fibrillation: Secondary | ICD-10-CM | POA: Diagnosis not present

## 2023-08-28 DIAGNOSIS — D6869 Other thrombophilia: Secondary | ICD-10-CM | POA: Diagnosis not present

## 2023-08-28 MED ORDER — APIXABAN 5 MG PO TABS
5.0000 mg | ORAL_TABLET | Freq: Two times a day (BID) | ORAL | 1 refills | Status: DC
Start: 2023-08-28 — End: 2023-08-30

## 2023-08-28 NOTE — Telephone Encounter (Signed)
 Walk in to clinic- Patient was denied for eliquis  and has not met her OOP expenses to be able to resubmit. Patient is going to call about payment plans.

## 2023-08-28 NOTE — Telephone Encounter (Signed)
*  STAT* If patient is at the pharmacy, call can be transferred to refill team.   1. Which medications need to be refilled? (please list name of each medication and dose if known) new prescription for Eliquis    2. Would you like to learn more about the convenience, safety, & potential cost savings by using the [facility]?    3. Are you open to using the [facility] (Type [facility].    4. Which pharmacy/location (including street and city if local pharmacy) is medication to be sent to?[facility] [address], [place]    5. Do they need a 30 day or 90 day supply? 90 days and refills

## 2023-08-28 NOTE — Patient Instructions (Signed)

## 2023-08-28 NOTE — Telephone Encounter (Signed)
 Pt last saw Dr Delford 08/28/23, last labs 06/17/23 Creat 0.84 per [facility] at [facility], age 68, weight 112.9kg, based on specified criteria pt is on appropriate dosage of Eliquis  5mg  BID for afib.  Will refill rx.

## 2023-08-29 ENCOUNTER — Telehealth: Payer: Self-pay

## 2023-08-29 MED ORDER — GEMTESA 75 MG PO TABS
1.0000 | ORAL_TABLET | Freq: Every day | ORAL | 11 refills | Status: DC
Start: 2023-08-29 — End: 2023-10-11

## 2023-08-29 NOTE — Telephone Encounter (Signed)
 Refilled sent to [facility].

## 2023-08-29 NOTE — Telephone Encounter (Signed)
-----   Message from Lauraine JAYSON Oz sent at 08/29/2023  9:14 AM EDT ----- Please submit refill through requested pharmacy ----- Message ----- From: Gretta Carlos SAUNDERS, CMA Sent: 08/28/2023   3:39 PM EDT To: Lauraine JAYSON Oz, FNP  Please review

## 2023-08-29 NOTE — Addendum Note (Signed)
 Addended by: GRETTA MASTERS R on: 08/29/2023 10:05 AM   Modules accepted: Orders

## 2023-08-30 ENCOUNTER — Other Ambulatory Visit: Payer: Self-pay | Admitting: *Deleted

## 2023-08-30 DIAGNOSIS — I480 Paroxysmal atrial fibrillation: Secondary | ICD-10-CM

## 2023-08-30 MED ORDER — APIXABAN 5 MG PO TABS
5.0000 mg | ORAL_TABLET | Freq: Two times a day (BID) | ORAL | 1 refills | Status: DC
Start: 2023-08-30 — End: 2023-10-11

## 2023-08-30 NOTE — Telephone Encounter (Signed)
 Prescription refill request for Eliquis  received. Indication: afib  Last office visit: Emily Andersen 08/28/2023 Scr: 0.84, 06/17/2023 Age: 67 yo  Weight: 112.9 kg    Refill sent.

## 2023-10-09 ENCOUNTER — Other Ambulatory Visit: Payer: Self-pay | Admitting: Cardiovascular Disease

## 2023-10-11 ENCOUNTER — Other Ambulatory Visit: Payer: Self-pay | Admitting: Cardiovascular Disease

## 2023-10-11 ENCOUNTER — Other Ambulatory Visit: Payer: Self-pay

## 2023-10-11 ENCOUNTER — Telehealth: Payer: Self-pay | Admitting: Urology

## 2023-10-11 DIAGNOSIS — I480 Paroxysmal atrial fibrillation: Secondary | ICD-10-CM

## 2023-10-11 MED ORDER — DILTIAZEM HCL ER COATED BEADS 240 MG PO CP24: 240.0000 mg | ORAL_CAPSULE | Freq: Every day | ORAL | 3 refills | Status: AC

## 2023-10-11 MED ORDER — APIXABAN 5 MG PO TABS: 5.0000 mg | ORAL_TABLET | Freq: Two times a day (BID) | ORAL | 1 refills | Status: AC

## 2023-10-11 MED ORDER — FLECAINIDE ACETATE 50 MG PO TABS: 75.0000 mg | ORAL_TABLET | Freq: Two times a day (BID) | ORAL | 3 refills | Status: AC

## 2023-10-11 NOTE — Telephone Encounter (Signed)
 *  STAT* If patient is at the pharmacy, call can be transferred to refill team.   1. Which medications need to be refilled? (please list name of each medication and dose if known) apixaban  (ELIQUIS ) 5 MG TABS tablet    diltiazem  (CARDIZEM  CD) 240 MG 24 hr capsule  flecainide  (TAMBOCOR ) 50 MG tablet  2. Would you like to learn more about the convenience, safety, & potential cost savings by using the [facility]? No      3. Are you open to using the [facility] (Type [facility]. ). No    4. Which pharmacy/location (including street and city if local pharmacy) is medication to be sent to? [id number] [place], [place] - [address]    5. Do they need a 30 day or 90 day supply? 90 days

## 2023-10-11 NOTE — Telephone Encounter (Signed)
 Please update patient medication list that she is no longer taking the Gemtesa , does not feel it works for her and she is doing alright without it.

## 2023-10-11 NOTE — Telephone Encounter (Signed)
 Prescription refill request for Eliquis  received. Indication: Afib Last office visit: 08/28/23 Daine)  Scr: 0.88 (09/03/23)  Age: 68 Weight: 112.9kg  Appropriate dose. Refill sent.

## 2023-10-11 NOTE — Telephone Encounter (Signed)
 Removed from med list.

## 2023-10-11 NOTE — Telephone Encounter (Signed)
 Pt's medications were sent to pt's pharmacy as requested. Confirmation received.

## 2023-12-10 ENCOUNTER — Telehealth: Payer: Self-pay | Admitting: Pharmacy Technician

## 2023-12-10 ENCOUNTER — Other Ambulatory Visit (HOSPITAL_COMMUNITY): Payer: Self-pay

## 2023-12-10 NOTE — Telephone Encounter (Signed)
    Insurance requires xarelto or eliquis . Too soon to see what the copay will be next fill. Pradaxa not covered on plan. She doesn't have heart failure nor cardiomyopathy so not eligible for grant.   I called the patient and left her a message

## 2024-01-14 ENCOUNTER — Ambulatory Visit: Admitting: Urology
